# Patient Record
Sex: Female | Born: 1985 | Race: Black or African American | Hispanic: No | Marital: Single | Smoking: Never smoker
Health system: Southern US, Community
[De-identification: ages and names within clinical notes are randomized; demographics above are authoritative.]

## PROBLEM LIST (undated history)

## (undated) DIAGNOSIS — N920 Excessive and frequent menstruation with regular cycle: Secondary | ICD-10-CM

## (undated) DIAGNOSIS — R569 Unspecified convulsions: Secondary | ICD-10-CM

## (undated) HISTORY — PX: NO PAST SURGERIES: SHX2092

---
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
## 2008-03-13 ENCOUNTER — Emergency Department: Payer: Self-pay | Admitting: Emergency Medicine

## 2013-09-08 DIAGNOSIS — N871 Moderate cervical dysplasia: Secondary | ICD-10-CM | POA: Insufficient documentation

## 2015-05-31 DIAGNOSIS — N879 Dysplasia of cervix uteri, unspecified: Secondary | ICD-10-CM | POA: Insufficient documentation

## 2015-11-13 ENCOUNTER — Emergency Department: Payer: PRIVATE HEALTH INSURANCE

## 2015-11-13 ENCOUNTER — Emergency Department
Admission: EM | Admit: 2015-11-13 | Discharge: 2015-11-13 | Disposition: A | Discharge status: Home / Self Care | Payer: PRIVATE HEALTH INSURANCE | Attending: Emergency Medicine | Admitting: Emergency Medicine

## 2015-11-13 ENCOUNTER — Encounter: Payer: Self-pay | Admitting: Emergency Medicine

## 2015-11-13 DIAGNOSIS — R55 Syncope and collapse: Secondary | ICD-10-CM | POA: Diagnosis not present

## 2015-11-13 DIAGNOSIS — J101 Influenza due to other identified influenza virus with other respiratory manifestations: Secondary | ICD-10-CM

## 2015-11-13 DIAGNOSIS — R000 Tachycardia, unspecified: Secondary | ICD-10-CM | POA: Diagnosis not present

## 2015-11-13 DIAGNOSIS — R509 Fever, unspecified: Secondary | ICD-10-CM | POA: Diagnosis present

## 2015-11-13 DIAGNOSIS — Z3202 Encounter for pregnancy test, result negative: Secondary | ICD-10-CM | POA: Diagnosis not present

## 2015-11-13 LAB — CBC WITH DIFFERENTIAL/PLATELET
BASOS PCT: 0 %
Basophils Absolute: 0 10*3/uL (ref 0–0.1)
EOS PCT: 0 %
Eosinophils Absolute: 0 10*3/uL (ref 0–0.7)
HEMATOCRIT: 38.4 % (ref 35.0–47.0)
Hemoglobin: 13.4 g/dL (ref 12.0–16.0)
Lymphocytes Relative: 12 %
Lymphs Abs: 1.1 10*3/uL (ref 1.0–3.6)
MCH: 32.8 pg (ref 26.0–34.0)
MCHC: 34.9 g/dL (ref 32.0–36.0)
MCV: 93.9 fL (ref 80.0–100.0)
MONO ABS: 0.6 10*3/uL (ref 0.2–0.9)
MONOS PCT: 7 %
NEUTROS ABS: 7.4 10*3/uL — AB (ref 1.4–6.5)
Neutrophils Relative %: 81 %
PLATELETS: 248 10*3/uL (ref 150–440)
RBC: 4.09 MIL/uL (ref 3.80–5.20)
RDW: 13 % (ref 11.5–14.5)
WBC: 9.2 10*3/uL (ref 3.6–11.0)

## 2015-11-13 LAB — COMPREHENSIVE METABOLIC PANEL
ALBUMIN: 4.5 g/dL (ref 3.5–5.0)
ALT: 20 U/L (ref 14–54)
ANION GAP: 7 (ref 5–15)
AST: 22 U/L (ref 15–41)
Alkaline Phosphatase: 64 U/L (ref 38–126)
BILIRUBIN TOTAL: 1.3 mg/dL — AB (ref 0.3–1.2)
BUN: 9 mg/dL (ref 6–20)
CHLORIDE: 107 mmol/L (ref 101–111)
CO2: 21 mmol/L — ABNORMAL LOW (ref 22–32)
Calcium: 9.4 mg/dL (ref 8.9–10.3)
Creatinine, Ser: 0.91 mg/dL (ref 0.44–1.00)
GFR calc Af Amer: >60 mL/min (ref >60)
GLUCOSE: 101 mg/dL — AB (ref 65–99)
POTASSIUM: 3.9 mmol/L (ref 3.5–5.1)
Sodium: 135 mmol/L (ref 135–145)
TOTAL PROTEIN: 8.1 g/dL (ref 6.5–8.1)

## 2015-11-13 LAB — GLUCOSE, CAPILLARY: GLUCOSE-CAPILLARY: 93 mg/dL (ref 65–99)

## 2015-11-13 LAB — LIPASE, BLOOD: LIPASE: 16 U/L (ref 11–51)

## 2015-11-13 LAB — HCG, QUANTITATIVE, PREGNANCY: hCG, Beta Chain, Quant, S: <1 m[IU]/mL (ref <5)

## 2015-11-13 LAB — RAPID INFLUENZA A&B ANTIGENS (ARMC ONLY)
INFLUENZA A (ARMC): POSITIVE
INFLUENZA B (ARMC): NEGATIVE

## 2015-11-13 MED ORDER — ONDANSETRON 4 MG PO TBDP: 4.0000 mg | ORAL_TABLET | Freq: Four times a day (QID) | ORAL | Status: DC | PRN

## 2015-11-13 MED ORDER — OSELTAMIVIR PHOSPHATE 75 MG PO CAPS
75.0000 mg | ORAL_CAPSULE | ORAL | Status: AC
  Administered 2015-11-13: 75 mg via ORAL
  Filled 2015-11-13 (×2): qty 1

## 2015-11-13 MED ORDER — OSELTAMIVIR PHOSPHATE 75 MG PO CAPS: 75.0000 mg | ORAL_CAPSULE | Freq: Two times a day (BID) | ORAL | Status: DC

## 2015-11-13 MED ORDER — SODIUM CHLORIDE 0.9 % IV BOLUS (SEPSIS)
2000.0000 mL | Freq: Once | INTRAVENOUS | Status: AC
  Administered 2015-11-13: 2000 mL via INTRAVENOUS

## 2015-11-13 MED ORDER — ACETAMINOPHEN 500 MG PO TABS
1000.0000 mg | ORAL_TABLET | ORAL | Status: AC
  Administered 2015-11-13: 1000 mg via ORAL
  Filled 2015-11-13: qty 2

## 2015-11-13 MED ORDER — ONDANSETRON HCL 4 MG/2ML IJ SOLN
4.0000 mg | Freq: Once | INTRAMUSCULAR | Status: AC
  Administered 2015-11-13: 4 mg via INTRAVENOUS
  Filled 2015-11-13: qty 2

## 2015-11-13 NOTE — Discharge Instructions (Signed)
You were diagnosed with the flu (influenza).  You will feel ill for as much as a few weeks.  Please take any prescribed medications as instructed, and you may use over-the-counter Tylenol and/or ibuprofen as needed according to label instructions (unless you have an allergy to either or have been told by your doctor not to take them).  Follow up with your physician as instructed above, and return to the Emergency Department (ED) if you are unable to tolerate fluids due to vomiting, have worsening trouble breathing, become extremely tired or difficult to awaken, or if you develop any other symptoms that concern you.   Influenza, Adult Influenza ("the flu") is a viral infection of the respiratory tract. It occurs more often in winter months because people spend more time in close contact with one another. Influenza can make you feel very sick. Influenza easily spreads from person to person (contagious). CAUSES  Influenza is caused by a virus that infects the respiratory tract. You can catch the virus by breathing in droplets from an infected person's cough or sneeze. You can also catch the virus by touching something that was recently contaminated with the virus and then touching your mouth, nose, or eyes. RISKS AND COMPLICATIONS You may be at risk for a more severe case of influenza if you smoke cigarettes, have diabetes, have chronic heart disease (such as heart failure) or lung disease (such as asthma), or if you have a weakened immune system. Elderly people and pregnant women are also at risk for more serious infections. The most common problem of influenza is a lung infection (pneumonia). Sometimes, this problem can require emergency medical care and may be life threatening. SIGNS AND SYMPTOMS  Symptoms typically last 4 to 10 days and may include:  Fever.  Chills.  Headache, body aches, and muscle aches.  Sore throat.  Chest discomfort and cough.  Poor appetite.  Weakness or feeling  tired.  Dizziness.  Nausea or vomiting. DIAGNOSIS  Diagnosis of influenza is often made based on your history and a physical exam. A nose or throat swab test can be done to confirm the diagnosis. TREATMENT  In mild cases, influenza goes away on its own. Treatment is directed at relieving symptoms. For more severe cases, your health care provider may prescribe antiviral medicines to shorten the sickness. Antibiotic medicines are not effective because the infection is caused by a virus, not by bacteria. HOME CARE INSTRUCTIONS  Take medicines only as directed by your health care provider.  Use a cool mist humidifier to make breathing easier.  Get plenty of rest until your temperature returns to normal. This usually takes 3 to 4 days.  Drink enough fluid to keep your urine clear or pale yellow.  Cover yourmouth and nosewhen coughing or sneezing,and wash your handswellto prevent thevirusfrom spreading.  Stay homefromwork orschool untilthe fever is gonefor at least 90full day. PREVENTION  An annual influenza vaccination (flu shot) is the best way to avoid getting influenza. An annual flu shot is now routinely recommended for all adults in the [place] SEEK MEDICAL CARE IF:  You experiencechest pain, yourcough worsens,or you producemore mucus.  Youhave nausea,vomiting, ordiarrhea.  Your fever returns or gets worse. SEEK IMMEDIATE MEDICAL CARE IF:  You havetrouble breathing, you become short of breath,or your skin ornails becomebluish.  You have severe painor stiffnessin the neck.  You develop a sudden headache, or pain in the face or ear.  You have nausea or vomiting that you cannot control. MAKE SURE YOU:  Understand these instructions.  Will watch your condition.  Will get help right away if you are not doing well or get worse.   This information is not intended to replace advice given to you by your health care provider. Make sure you discuss any  questions you have with your health care provider.   Document Released: 09/06/2000 Document Revised: 09/30/2014 Document Reviewed: 12/09/2011 Elsevier Interactive Patient Education [employer].

## 2015-11-13 NOTE — ED Notes (Signed)
Pt was walking with a RN to a room and had a loc . Pt was placed on a strecher and moved to room 8. MD in room . Pt vss. Pt is lethargic but will open eyes to voice.

## 2015-11-13 NOTE — ED Provider Notes (Signed)
[facility] Emergency Department Provider Note  ____________________________________________  Time seen: Approximately 5:27 PM  I have reviewed the triage vital signs and the nursing notes.   HISTORY  Chief Complaint Generalized Body Aches and Fever  and "unconscious"  The patient is nonverbal, unresponsive on my initial evaluation. EM caveat: Patient unable to provide history.  EM caveat: Patient unresponsive/unconscious  HPI Courtney [name] is a 30 y.o. female no previous medical history per mother.  Mother reports the patient has had increasing cough, runny nose and low-grade fevers which she thought might be "the flu". She's also had body aches. Came to the ER for evaluation, and while walking to x-ray the patient became lightheaded and said she felt weak and then "passed out". Mother reports she laid to her side and did not strike her head hard but did hit the floor.  A "CODE BLUE" was called initially to the hallway.   History reviewed. No pertinent past medical history.  There are no active problems to display for this patient.   History reviewed. No pertinent past surgical history.  Current Outpatient Rx  Name  Route  Sig  Dispense  Refill  . ondansetron (ZOFRAN ODT) 4 MG disintegrating tablet   Oral   Take 1 tablet (4 mg total) by mouth every 6 (six) hours as needed for nausea or vomiting.   20 tablet   0   . oseltamivir (TAMIFLU) 75 MG capsule   Oral   Take 1 capsule (75 mg total) by mouth 2 (two) times daily.   10 capsule   0     Allergies Review of patient's allergies indicates no known allergies.  No family history on file.  Social History Social History  Substance Use Topics  . Smoking status: Never Smoker   . Smokeless tobacco: None  . Alcohol Use: No    Review of Systems  EM caveat: The patient unable to provide, unconscious ____________________________________________   PHYSICAL EXAM:  VITAL SIGNS: ED  Triage Vitals  Enc Vitals Group     BP 11/13/15 1630 112/72 mmHg     Pulse Rate 11/13/15 1630 117     Resp 11/13/15 1630 20     Temp 11/13/15 1630 99.8 F (37.7 C)     Temp Source 11/13/15 1630 Oral     SpO2 11/13/15 1630 99 %     Weight 11/13/15 1630 141 lb (63.957 kg)     Height 11/13/15 1630  (1.676 m)     Head Cir --      Peak Flow --      Pain Score 11/13/15 1630 10     Pain Loc --      Pain Edu? --      Excl. in GC? --    On initial evaluation the patient was unresponsive. Respiratory adequately, normal oxygen saturation strong radial pulse. This lasted for only a couple of minutes and improves with supine positioning. Patient no loss of pulse, no arrhythmia. Normal sinus on telemetry throughout.  Full physical exam performed approximately 10-15 minutes after initial presentation at which time the patient's neurologic exam was much improved.  Constitutional: Alert and oriented. Well appearing and in no acute distress. Eyes: Conjunctivae are normal. PERRL. EOMI. Head: Atraumatic. Nose: Mild clear rhinorrhea. Mouth/Throat: Mucous membranes are moist.  Oropharynx non-erythematous. Neck: No stridor.  No cervical spine tenderness. No meningismus. Cardiovascular: Slightly tachycardic rate, regular rhythm. Grossly normal heart sounds.  Good peripheral circulation. Respiratory: Normal respiratory effort.  No retractions. Lungs CTAB. Gastrointestinal: Soft and nontender. No distention. No abdominal bruits. No CVA tenderness. Musculoskeletal: No lower extremity tenderness nor edema.  No joint effusions. Neurologic:  Normal speech and language. No gross focal neurologic deficits are appreciated. Skin:  Skin is warm, dry and intact. No rash noted. Psychiatric: Mood and affect are normal. Speech and behavior are normal.  ____________________________________________   LABS (all labs ordered are listed, but only abnormal results are displayed)  Labs Reviewed  CBC WITH  DIFFERENTIAL/PLATELET - Abnormal; Notable for the following:    Neutro Abs 7.4 (*)    All other components within normal limits  COMPREHENSIVE METABOLIC PANEL - Abnormal; Notable for the following:    CO2 21 (*)    Glucose, Bld 101 (*)    Total Bilirubin 1.3 (*)    All other components within normal limits  RAPID INFLUENZA A&B ANTIGENS ([facility] ONLY)  GLUCOSE, CAPILLARY  LIPASE, BLOOD  HCG, QUANTITATIVE, PREGNANCY   ____________________________________________  EKG  Reviewed and interpreted me at 1711 Heart rate 92 QRS 70 QTc 400 Normal sinus rhythm, no acute ischemic abnormalities. No prolonged QT, Brugada, or WPW noted.  ____________________________________________  RADIOLOGY  DG Chest Port 1 View (Final result) Result time: 11/13/15 17:35:29   Final result by Rad Results In Interface (11/13/15 17:35:29)   Narrative:   CLINICAL DATA: Flu-like symptoms today, fever, generalized body aches  EXAM: PORTABLE CHEST 1 VIEW  COMPARISON: 09/11/2005  FINDINGS: Cardiomediastinal silhouette is unremarkable. No acute infiltrate or pleural effusion. No pulmonary edema. Bilateral nipple metallic pins are noted.  IMPRESSION: No active disease.   Electronically Signed By: Natasha Mead M.D. On: 11/13/2015 17:35    ____________________________________________   PROCEDURES  Procedure(s) performed: None  Critical Care performed: Yes, see critical care note(s)  CRITICAL CARE Performed by: Sharyn Creamer   Total critical care time: 35 minutes  Critical care time was exclusive of separately billable procedures and treating other patients.  Critical care was necessary to treat or prevent imminent or life-threatening deterioration.  Critical care was time spent personally by me on the following activities: development of treatment plan with patient and/or surrogate as well as nursing, discussions with consultants, evaluation of patient's response to treatment,  examination of patient, obtaining history from patient or surrogate, ordering and performing treatments and interventions, ordering and review of laboratory studies, ordering and review of radiographic studies, pulse oximetry and re-evaluation of patient's condition.  Patient presented with an episode of unresponsiveness. She did quickly improve, and regained neurologic status to a normal exam. ____________________________________________   INITIAL IMPRESSION / ASSESSMENT AND PLAN / ED COURSE  Pertinent labs & imaging results that were available during my care of the patient were reviewed by me and considered in my medical decision making (see chart for details).  Patient presented for fever chills cough cold and myalgias. We'll walk in x-ray she had a syncopal episode. This was witnessed by ER staff and they report there was no seizure-like activity. Patient arrives the room unresponsive.  After approximately 3-5 minutes the patient slowly began mentating.   Differential diagnosis broad. Patient does however have viral/influenza-like symptoms. We'll obtain a chest x-ray, EKG, labs and evaluate closely.  ----------------------------------------- 6:42 PM on 11/13/2015 -----------------------------------------  Patient now fully awake and alert. She is in no distress. She reports feeling mild nausea. She feels much better. Patient now fully awake and alert. She reports symptoms of muscle aches, low-grade fever, runny nose and a dry cough for about the last 2 days.  She presently reports she does feel slightly "dehydrated". No chest pain. No trouble breathing.  The patient has no pronator drift. The patient has normal cranial nerve exam. Extraocular movements are normal. Visual fields are normal. Patient has 5 out of 5 strength in all extremities. There is no numbness or gross, acute sensory abnormality in the extremities bilaterally. No speech disturbance. No dysarthria. No aphasia. No  ataxia. Patient speaking in full and clear sentences.  ----------------------------------------- 7:12 PM on 11/13/2015 -----------------------------------------  Patient reports improvement. Discussed diagnosis, treatment recommendations. She is not orthostatic. Patient heart rate currently 90, improved after fluids. Patient did have slight tachycardia with orthostatics, no hypotension. She is taking by mouth well, in no distress at this time. Careful return precautions and follow-up recommendations advised. No hypoxia. Family taking her home.    ____________________________________________   FINAL CLINICAL IMPRESSION(S) / ED DIAGNOSES  Final diagnoses:  Influenza A  Vasovagal syncope      Sharyn Creamer, MD 11/14/15 [phone]

## 2015-11-13 NOTE — ED Notes (Signed)
Pt presents with flu like sx for two days.

## 2016-10-17 ENCOUNTER — Encounter: Payer: Self-pay | Admitting: Emergency Medicine

## 2016-10-17 ENCOUNTER — Emergency Department
Admission: EM | Admit: 2016-10-17 | Discharge: 2016-10-17 | Disposition: A | Discharge status: Home / Self Care | Payer: PRIVATE HEALTH INSURANCE | Attending: Emergency Medicine | Admitting: Emergency Medicine

## 2016-10-17 DIAGNOSIS — H00024 Hordeolum internum left upper eyelid: Secondary | ICD-10-CM | POA: Insufficient documentation

## 2016-10-17 MED ORDER — TOBRAMYCIN 0.3 % OP SOLN: 2.0000 [drp] | OPHTHALMIC | 0 refills | Status: DC

## 2016-10-17 NOTE — ED Provider Notes (Signed)
[facility] Emergency Department Provider Note  ____________________________________________   First MD Initiated Contact with Patient 10/17/16 [phone]     (approximate)  I have reviewed the triage vital signs and the nursing notes.   HISTORY  Chief Complaint Eye Pain    HPI Courtney [name] is a 31 y.o. female is here with complaint of left eye irritation. Patient states she works morning with a raised area noted on her left upper eyelid. She denies any injury. She denies any change in vision. There's been no drainage from the eye and she denies her eyelashes being matted shut. She is unaware of any previous problems such as this. She rates her pain as a 3/10.   History reviewed. No pertinent past medical history.  There are no active problems to display for this patient.   History reviewed. No pertinent surgical history.  Prior to Admission medications   Medication Sig Start Date End Date Taking? Authorizing Provider  tobramycin (TOBREX) 0.3 % ophthalmic solution Place 2 drops into the left eye every 4 (four) hours. 10/17/16   Tommi Rumpshonda L Juhi Lagrange, PA-C    Allergies Patient has no known allergies.  No family history on file.  Social History Social History  Substance Use Topics  . Smoking status: Never Smoker  . Smokeless tobacco: Never Used  . Alcohol use No    Review of Systems Constitutional: No fever/chills Eyes: No visual changes.Positive Discomfort left upper eyelid. ENT: No sore throat. Cardiovascular: Denies chest pain. Respiratory: Denies shortness of breath. Gastrointestinal:  No nausea, no vomiting.   Skin: Negative for rash. Neurological: Negative for headaches, focal weakness or numbness.  10-point ROS otherwise negative.  ____________________________________________   PHYSICAL EXAM:  VITAL SIGNS: ED Triage Vitals  Enc Vitals Group     BP 10/17/16 0728 116/79     Pulse Rate 10/17/16 0728 77     Resp 10/17/16 0728 20     Temp 10/17/16 0728 98.4 F (36.9 C)     Temp Source 10/17/16 0728 Oral     SpO2 10/17/16 0728 100 %     Weight 10/17/16 0728 144 lb (65.3 kg)     Height 10/17/16 0728 5\' 6"  (1.676 m)     Head Circumference --      Peak Flow --      Pain Score 10/17/16 0729 3     Pain Loc --      Pain Edu? --      Excl. in GC? --     Constitutional: Alert and oriented. Well appearing and in no acute distress. Eyes: Conjunctivae are normal. PERRL. EOMI. Left upper eyelid there is a fleshy nodule noted that is nontender to palpation. There is no erythema or drainage noted. Sclerae clear. No foreign body noted. Head: Atraumatic. Nose: No congestion/rhinnorhea. Neck: No stridor.   Cardiovascular: Normal rate, regular rhythm. Grossly normal heart sounds.  Good peripheral circulation. Respiratory: Normal respiratory effort.  No retractions. Lungs CTAB. Musculoskeletal: No lower extremity tenderness nor edema.  No joint effusions. Neurologic:  Normal speech and language. No gross focal neurologic deficits are appreciated.  Skin:  Skin is warm, dry and intact. As noted above on eyes. Psychiatric: Mood and affect are normal. Speech and behavior are normal.  ____________________________________________   LABS (all labs ordered are listed, but only abnormal results are displayed)  Labs Reviewed - No data to display   PROCEDURES  Procedure(s) performed: None  Procedures  Critical Care performed: No  ____________________________________________  INITIAL IMPRESSION / ASSESSMENT AND PLAN / ED COURSE  Pertinent labs & imaging results that were available during my care of the patient were reviewed by me and considered in my medical decision making (see chart for details).   Patient is given instructions on corneal limbs. She is to use warm compresses and also she is given a prescription for Tobrex ophthalmic solution to use to her left eye every 4 hours while awake. Patient is also to follow-up  with [facility] if any continued problems with her left eye. Vision today in the emergency room was reviewed.   ____________________________________________   FINAL CLINICAL IMPRESSION(S) / ED DIAGNOSES  Final diagnoses:  Hordeolum internum left upper eyelid      NEW MEDICATIONS STARTED DURING THIS VISIT:  Discharge Medication List as of 10/17/2016  8:07 AM    START taking these medications   Details  tobramycin (TOBREX) 0.3 % ophthalmic solution Place 2 drops into the left eye every 4 (four) hours., Starting Thu 10/17/2016, Print         Note:  This document was prepared using Dragon voice recognition software and may include unintentional dictation errors.    Tommi Rumps, PA-C 10/17/16 1520    Emily Filbert, MD 10/18/16 1455

## 2016-10-17 NOTE — ED Triage Notes (Signed)
States she woke up with left eye irritation this   A raised area note left eye lid

## 2016-10-17 NOTE — Discharge Instructions (Signed)
Follow-up with [facility] if any continued problems. Begin using warm compresses to your eyelid frequently. Use Tobrex ophthalmic solution 2 drops every 4 hours to the left eye while awake.

## 2017-02-07 ENCOUNTER — Encounter: Payer: Self-pay | Admitting: Certified Nurse Midwife

## 2017-03-04 ENCOUNTER — Emergency Department
Admission: EM | Admit: 2017-03-04 | Discharge: 2017-03-04 | Disposition: A | Discharge status: Home / Self Care | Payer: Self-pay | Attending: Emergency Medicine | Admitting: Emergency Medicine

## 2017-03-04 DIAGNOSIS — N3000 Acute cystitis without hematuria: Secondary | ICD-10-CM | POA: Insufficient documentation

## 2017-03-04 DIAGNOSIS — Z79899 Other long term (current) drug therapy: Secondary | ICD-10-CM | POA: Insufficient documentation

## 2017-03-04 LAB — URINALYSIS, COMPLETE (UACMP) WITH MICROSCOPIC
Bilirubin Urine: NEGATIVE
Bilirubin Urine: NEGATIVE
GLUCOSE, UA: NEGATIVE mg/dL
GLUCOSE, UA: NEGATIVE mg/dL
Hgb urine dipstick: NEGATIVE
Hgb urine dipstick: NEGATIVE
Ketones, ur: NEGATIVE mg/dL
Ketones, ur: NEGATIVE mg/dL
Leukocytes, UA: NEGATIVE
Leukocytes, UA: NEGATIVE
Nitrite: NEGATIVE
Nitrite: NEGATIVE
PH: 6 (ref 5.0–8.0)
Protein, ur: NEGATIVE mg/dL
Protein, ur: NEGATIVE mg/dL
SPECIFIC GRAVITY, URINE: 1.015 (ref 1.005–1.030)
SPECIFIC GRAVITY, URINE: 1.021 (ref 1.005–1.030)
pH: 7 (ref 5.0–8.0)

## 2017-03-04 LAB — WET PREP, GENITAL
CLUE CELLS WET PREP: NONE SEEN
SPERM: NONE SEEN
TRICH WET PREP: NONE SEEN
YEAST WET PREP: NONE SEEN

## 2017-03-04 LAB — POCT PREGNANCY, URINE: Preg Test, Ur: NEGATIVE

## 2017-03-04 MED ORDER — SULFAMETHOXAZOLE-TRIMETHOPRIM 800-160 MG PO TABS
1.0000 | ORAL_TABLET | Freq: Two times a day (BID) | ORAL | 0 refills | Status: DC
Start: 2017-03-04 — End: 2017-07-24

## 2017-03-04 MED ORDER — SULFAMETHOXAZOLE-TRIMETHOPRIM 800-160 MG PO TABS
1.0000 | ORAL_TABLET | Freq: Once | ORAL | Status: AC
  Administered 2017-03-04: 1 via ORAL
  Filled 2017-03-04: qty 1

## 2017-03-04 NOTE — ED Provider Notes (Signed)
[facility]-EMERGENCY DEPARTMENT Provider Note   CSN: [id number] Arrival date & time: 03/04/17  1906     History   Chief Complaint Chief Complaint  Patient presents with  . Urinary Tract Infection    HPI VIRGINIA CURL is a 31 y.o. female presents to the emergency department for evaluation of increasing urinary frequency 1 week. Patient states she's had burning and urinary frequency 1 week. No fevers, back pain, nausea or vomiting. She has had some vaginal discharge. No new sexual partners. No painful intercourse. She has noticed a odor to her urine.   HPI  History reviewed. No pertinent past medical history.  There are no active problems to display for this patient.   History reviewed. No pertinent surgical history.  OB History    No data available       Home Medications    Prior to Admission medications   Medication Sig Start Date End Date Taking? Authorizing Provider  sulfamethoxazole-trimethoprim (BACTRIM DS,SEPTRA DS) 800-160 MG tablet Take 1 tablet by mouth 2 (two) times daily. 03/04/17   Evon Slack, PA-C  tobramycin (TOBREX) 0.3 % ophthalmic solution Place 2 drops into the left eye every 4 (four) hours. 10/17/16   Tommi Rumps, PA-C    Family History No family history on file.  Social History Social History  Substance Use Topics  . Smoking status: Never Smoker  . Smokeless tobacco: Never Used  . Alcohol use No     Comment: occ     Allergies   Patient has no known allergies.   Review of Systems Review of Systems  Constitutional: Negative for activity change, chills, fatigue and fever.  HENT: Negative for congestion, sinus pressure and sore throat.   Eyes: Negative for visual disturbance.  Respiratory: Negative for cough, chest tightness and shortness of breath.   Cardiovascular: Negative for chest pain and leg swelling.  Gastrointestinal: Negative for abdominal pain, diarrhea, nausea and vomiting.  Genitourinary: Positive for dysuria.  Negative for dyspareunia and flank pain.  Musculoskeletal: Negative for arthralgias and gait problem.  Skin: Negative for rash.  Neurological: Negative for weakness, numbness and headaches.  Hematological: Negative for adenopathy.  Psychiatric/Behavioral: Negative for agitation, behavioral problems and confusion.     Physical Exam Updated Vital Signs BP 115/68 (BP Location: Left Arm)   Temp 98.5 F (36.9 C) (Oral)   Resp 18   Ht 5\' 6"  (1.676 m)   Wt 64 kg (141 lb)   LMP 02/13/2017   SpO2 100%   BMI 22.76 kg/m   Physical Exam  Constitutional: She is oriented to person, place, and time. She appears well-developed and well-nourished. No distress.  HENT:  Head: Normocephalic and atraumatic.  Mouth/Throat: Oropharynx is clear and moist.  Eyes: EOM are normal. Pupils are equal, round, and reactive to light. Right eye exhibits no discharge. Left eye exhibits no discharge.  Neck: Normal range of motion. Neck supple.  Cardiovascular: Normal rate, regular rhythm and intact distal pulses.   Pulmonary/Chest: Effort normal and breath sounds normal. No respiratory distress. She exhibits no tenderness.  Abdominal: Soft. She exhibits no distension. There is no tenderness.  Musculoskeletal: Normal range of motion. She exhibits no edema.  Neurological: She is alert and oriented to person, place, and time. She has normal reflexes.  Skin: Skin is warm and dry.  Psychiatric: She has a normal mood and affect. Her behavior is normal. Thought content normal.     ED Treatments / Results  Labs (all labs ordered are listed,  but only abnormal results are displayed) Labs Reviewed  WET PREP, GENITAL - Abnormal; Notable for the following:       Result Value   WBC, Wet Prep HPF POC MANY (*)    All other components within normal limits  URINALYSIS, COMPLETE (UACMP) WITH MICROSCOPIC - Abnormal; Notable for the following:    Color, Urine YELLOW (*)    APPearance HAZY (*)    Bacteria, UA RARE (*)     Squamous Epithelial / LPF 6-30 (*)    All other components within normal limits  URINALYSIS, COMPLETE (UACMP) WITH MICROSCOPIC - Abnormal; Notable for the following:    Color, Urine YELLOW (*)    APPearance CLEAR (*)    Bacteria, UA RARE (*)    Squamous Epithelial / LPF 0-5 (*)    All other components within normal limits  CHLAMYDIA/NGC RT PCR ([facility] ONLY)  POCT PREGNANCY, URINE  POC URINE PREG, ED    EKG  EKG Interpretation None       Radiology No results found.  Procedures Procedures (including critical care time)  Medications Ordered in ED Medications  sulfamethoxazole-trimethoprim (BACTRIM DS,SEPTRA DS) 800-160 MG per tablet 1 tablet (not administered)     Initial Impression / Assessment and Plan / ED Course  I have reviewed the triage vital signs and the nursing notes.  Pertinent labs & imaging results that were available during my care of the patient were reviewed by me and considered in my medical decision making (see chart for details).     31 year old female with dysuria. Urinalysis consistent with urinary tract infection. She is placed on oral antibiotic. She will increase fluids. She is educated on signs and symptoms returned 84.   Final Clinical Impressions(s) / ED Diagnoses   Final diagnoses:  Acute cystitis without hematuria    New Prescriptions New Prescriptions   SULFAMETHOXAZOLE-TRIMETHOPRIM (BACTRIM DS,SEPTRA DS) 800-160 MG TABLET    Take 1 tablet by mouth 2 (two) times daily.     Evon SlackGaines, Shanikka Wonders C, PA-C 03/04/17 2210    Merrily Brittleifenbark, Neil, MD 03/04/17 2234

## 2017-03-04 NOTE — ED Notes (Signed)
Pt states urinary frequency x 1 week. States right before she goes she feels urgency and then lower abd cramping. Denies fever. Alert, oriented, ambulatory.

## 2017-03-04 NOTE — Discharge Instructions (Signed)
Please take medications as prescribed, drink lots of fluids. Return to the ER for any worsening symptoms urgent changes in her health.

## 2017-03-04 NOTE — ED Triage Notes (Signed)
Pt reports that she has a UTI - she report ammonia smell to urine with a urge to urinate and feels discomfort with voiding

## 2017-03-05 LAB — CHLAMYDIA/NGC RT PCR (ARMC ONLY)
Chlamydia Tr: NOT DETECTED
N GONORRHOEAE: NOT DETECTED

## 2017-07-19 ENCOUNTER — Emergency Department: Payer: Self-pay

## 2017-07-19 ENCOUNTER — Emergency Department
Admission: EM | Admit: 2017-07-19 | Discharge: 2017-07-20 | Disposition: A | Discharge status: Home / Self Care | Payer: Self-pay | Attending: Emergency Medicine | Admitting: Emergency Medicine

## 2017-07-19 DIAGNOSIS — Z79899 Other long term (current) drug therapy: Secondary | ICD-10-CM | POA: Insufficient documentation

## 2017-07-19 DIAGNOSIS — R51 Headache: Secondary | ICD-10-CM | POA: Insufficient documentation

## 2017-07-19 DIAGNOSIS — R569 Unspecified convulsions: Secondary | ICD-10-CM | POA: Insufficient documentation

## 2017-07-19 LAB — BASIC METABOLIC PANEL
ANION GAP: 8 (ref 5–15)
BUN: 10 mg/dL (ref 6–20)
CO2: 23 mmol/L (ref 22–32)
Calcium: 9.2 mg/dL (ref 8.9–10.3)
Chloride: 108 mmol/L (ref 101–111)
Creatinine, Ser: 0.78 mg/dL (ref 0.44–1.00)
GFR calc Af Amer: >60 mL/min (ref >60)
Glucose, Bld: 88 mg/dL (ref 65–99)
POTASSIUM: 3.9 mmol/L (ref 3.5–5.1)
SODIUM: 139 mmol/L (ref 135–145)

## 2017-07-19 LAB — CBC WITH DIFFERENTIAL/PLATELET
BASOS ABS: 0 10*3/uL (ref 0–0.1)
Basophils Relative: 1 %
Eosinophils Absolute: 0 10*3/uL (ref 0–0.7)
Eosinophils Relative: 1 %
HEMATOCRIT: 35.9 % (ref 35.0–47.0)
Hemoglobin: 12.5 g/dL (ref 12.0–16.0)
LYMPHS PCT: 27 %
Lymphs Abs: 1.7 10*3/uL (ref 1.0–3.6)
MCH: 32.9 pg (ref 26.0–34.0)
MCHC: 34.9 g/dL (ref 32.0–36.0)
MCV: 94.4 fL (ref 80.0–100.0)
MONO ABS: 0.4 10*3/uL (ref 0.2–0.9)
MONOS PCT: 7 %
NEUTROS ABS: 4 10*3/uL (ref 1.4–6.5)
Neutrophils Relative %: 64 %
PLATELETS: 213 10*3/uL (ref 150–440)
RBC: 3.81 MIL/uL (ref 3.80–5.20)
RDW: 12.8 % (ref 11.5–14.5)
WBC: 6.2 10*3/uL (ref 3.6–11.0)

## 2017-07-19 LAB — HEPATIC FUNCTION PANEL
ALT: 15 U/L (ref 14–54)
AST: 21 U/L (ref 15–41)
Albumin: 4 g/dL (ref 3.5–5.0)
Alkaline Phosphatase: 46 U/L (ref 38–126)
Bilirubin, Direct: 0.1 mg/dL (ref 0.1–0.5)
Indirect Bilirubin: 1 mg/dL — ABNORMAL HIGH (ref 0.3–0.9)
TOTAL PROTEIN: 7.5 g/dL (ref 6.5–8.1)
Total Bilirubin: 1.1 mg/dL (ref 0.3–1.2)

## 2017-07-19 LAB — HCG, QUANTITATIVE, PREGNANCY: hCG, Beta Chain, Quant, S: <1 m[IU]/mL (ref <5)

## 2017-07-19 MED ORDER — SODIUM CHLORIDE 0.9 % IV BOLUS (SEPSIS)
1000.0000 mL | Freq: Once | INTRAVENOUS | Status: AC
  Administered 2017-07-19: 1000 mL via INTRAVENOUS

## 2017-07-19 NOTE — ED Triage Notes (Signed)
Pt presents via EMS c/o right sided chest pain. Per EMS pt was taking shower and presented to family following "acting weird". Possible seizure per EMS report. Pt reports headache and right sided chest pain. Denies remembering episode after getting out of shower. Pt A&O x4 at this time.

## 2017-07-19 NOTE — ED Provider Notes (Signed)
[facility] Emergency Department Provider Note  ____________________________________________   First MD Initiated Contact with Patient 07/19/17 1249     (approximate)  I have reviewed the triage vital signs and the nursing notes.   HISTORY  Chief Complaint Chest Pain and Seizures  Level 5 exemption history limited by the patient's clinical condition  HPI Courtney [name] is a 31 y.o. female who was brought to the emergency department by EMS after possible syncope versus seizure.  The patient is amnestic to events.  According to EMS family reported the patient was in the shower today when she got out she said she felt lightheaded and sat herself down onto the ground.  She then had what may have been generalized tonic-clonic motions.  She had normal vitals en route along with a normal blood sugar.  As the patient had one previous lifetime seizure at age 31 and never since.  She takes no medications.  No past medical history on file.  There are no active problems to display for this patient.   No past surgical history on file.  Prior to Admission medications   Medication Sig Start Date End Date Taking? Authorizing Provider  Boric Acid POWD Place 1 suppository vaginally.   Yes [provider]  Probiotic Product (PROBIOTIC DAILY) CAPS Take 1 capsule by mouth.   Yes [provider]  sulfamethoxazole-trimethoprim (BACTRIM DS,SEPTRA DS) 800-160 MG tablet Take 1 tablet by mouth 2 (two) times daily. Patient not taking: Reported on 07/19/2017 03/04/17   Evon SlackGaines, Thomas C, PA-C  tobramycin (TOBREX) 0.3 % ophthalmic solution Place 2 drops into the left eye every 4 (four) hours. Patient not taking: Reported on 07/19/2017 10/17/16   Tommi RumpsSummers, Rhonda L, PA-C    Allergies Patient has no known allergies.  No family history on file.  Social History Social History  Substance Use Topics  . Smoking status: Never Smoker  . Smokeless tobacco: Never Used  .  Alcohol use No     Comment: occ    Review of Systems Level 5 exemption history limited by the patient's clinical condition ____________________________________________   PHYSICAL EXAM:  VITAL SIGNS: ED Triage Vitals  Enc Vitals Group     BP      Pulse      Resp      Temp      Temp src      SpO2      Weight      Height      Head Circumference      Peak Flow      Pain Score      Pain Loc      Pain Edu?      Excl. in GC?     Constitutional: Alert and oriented x4 pleasant cooperative speaks full sentences no diaphoresis Eyes: PERRL EOMI. Head: Atraumatic. Nose: No congestion/rhinnorhea. Mouth/Throat: No trismus no bites to her tongue or cheek Neck: No stridor.   Cardiovascular: Normal rate, regular rhythm. Grossly normal heart sounds.  Good peripheral circulation. Respiratory: Normal respiratory effort.  No retractions. Lungs CTAB and moving good air Gastrointestinal: Soft nontender Musculoskeletal: No lower extremity edema   Neurologic:  Normal speech and language.  No pronator drift 5 out of 5 grips biceps triceps hip flexion hip extension sensation intact light touch throughout Skin:  Skin is warm, dry and intact. No rash noted. Psychiatric: Mood and affect are normal. Speech and behavior are normal.    ____________________________________________   DIFFERENTIAL includes but not  limited to  Seizure, syncope, intracerebral hemorrhage, brain tumor, sepsis ____________________________________________   LABS (all labs ordered are listed, but only abnormal results are displayed)  Labs Reviewed  HEPATIC FUNCTION PANEL - Abnormal; Notable for the following:       Result Value   Indirect Bilirubin 1.0 (*)    All other components within normal limits  BASIC METABOLIC PANEL  CBC WITH DIFFERENTIAL/PLATELET  HCG, QUANTITATIVE, PREGNANCY  URINALYSIS, COMPLETE (UACMP) WITH MICROSCOPIC    Blood work reviewed and interpreted by me with no acute disease in the  patient is not. __________________________________________  EKG  ED ECG REPORT I, Merrily Brittle, the attending physician, personally viewed and interpreted this ECG.  Date: 07/19/2017 EKG Time:  Rate: 82 Rhythm: normal sinus rhythm QRS Axis: normal Intervals: normal ST/T Wave abnormalities: normal Narrative Interpretation: no evidence of acute ischemia specifically no blocks, normal intervals, no arrhythmia, no Brugada  ____________________________________________  RADIOLOGY  Head CT reviewed by me with no acute disease ____________________________________________   PROCEDURES  Procedure(s) performed: no  Procedures  Critical Care performed: no  Observation: no ____________________________________________   INITIAL IMPRESSION / ASSESSMENT AND PLAN / ED COURSE  Pertinent labs & imaging results that were available during my care of the patient were reviewed by me and considered in my medical decision making (see chart for details).      Family at bedside able to provide collateral history.  The patient's previous seizure when she was younger was not actually a seizure she had a clear syncopal episode secondary to anemia.  She has never actually had a seizure.  According to her boyfriend on the phone who is present patient collapsed while in the shower and did have generalized tonic-clonic motions.  It took her 20-30 minutes to become normal again.  Had a lengthy discussion with the patient and her family at bedside that for her first lifetime seizure she does not require an MRI or an EEG and we would not start antiepileptic medications.  She understands she is not to drive for 6 months and that if she has a second seizure she must have an MRI, EEG, and to begin antiepileptic medications.  Strict return precautions have been given and the patient and family verbalized understanding and agreement with plan.  ____________________________________________   FINAL CLINICAL  IMPRESSION(S) / ED DIAGNOSES  Final diagnoses:  Seizure (HCC)      NEW MEDICATIONS STARTED DURING THIS VISIT:  New Prescriptions   No medications on file     Note:  This document was prepared using Dragon voice recognition software and may include unintentional dictation errors.     Merrily Brittle, MD 07/19/17 1444

## 2017-07-19 NOTE — Discharge Instructions (Signed)
Fortunately today your blood work and your CT scan were very reassuring.  Please do not drive for the next 6 months because you are at risk for having another seizure.  Follow-up with your primary care physician in a week for reevaluation and return to the emergency department for any concerns.  It was a pleasure to take care of you today, and thank you for coming to our emergency department.  If you have any questions or concerns before leaving please ask the nurse to grab me and I'm more than happy to go through your aftercare instructions again.  If you were prescribed any opioid pain medication today such as Norco, Vicodin, Percocet, morphine, hydrocodone, or oxycodone please make sure you do not drive when you are taking this medication as it can alter your ability to drive safely.  If you have any concerns once you are home that you are not improving or are in fact getting worse before you can make it to your follow-up appointment, please do not hesitate to call 911 and come back for further evaluation.  Merrily BrittleNeil Duward Allbritton, MD  Results for orders placed or performed during the hospital encounter of 07/19/17  Basic metabolic panel  Result Value Ref Range   Sodium 139 135 - 145 mmol/L   Potassium 3.9 3.5 - 5.1 mmol/L   Chloride 108 101 - 111 mmol/L   CO2 23 22 - 32 mmol/L   Glucose, Bld 88 65 - 99 mg/dL   BUN 10 6 - 20 mg/dL   Creatinine, Ser 1.610.78 0.44 - 1.00 mg/dL   Calcium 9.2 8.9 - 09.610.3 mg/dL   GFR calc non Af Amer >60 >60 mL/min   GFR calc Af Amer >60 >60 mL/min   Anion gap 8 5 - 15  Hepatic function panel  Result Value Ref Range   Total Protein 7.5 6.5 - 8.1 g/dL   Albumin 4.0 3.5 - 5.0 g/dL   AST 21 15 - 41 U/L   ALT 15 14 - 54 U/L   Alkaline Phosphatase 46 38 - 126 U/L   Total Bilirubin 1.1 0.3 - 1.2 mg/dL   Bilirubin, Direct 0.1 0.1 - 0.5 mg/dL   Indirect Bilirubin 1.0 (H) 0.3 - 0.9 mg/dL  CBC with Differential  Result Value Ref Range   WBC 6.2 3.6 - 11.0 K/uL   RBC 3.81  3.80 - 5.20 MIL/uL   Hemoglobin 12.5 12.0 - 16.0 g/dL   HCT 04.535.9 40.935.0 - 81.147.0 %   MCV 94.4 80.0 - 100.0 fL   MCH 32.9 26.0 - 34.0 pg   MCHC 34.9 32.0 - 36.0 g/dL   RDW 91.412.8 78.211.5 - 95.614.5 %   Platelets 213 150 - 440 K/uL   Neutrophils Relative % 64 %   Neutro Abs 4.0 1.4 - 6.5 K/uL   Lymphocytes Relative 27 %   Lymphs Abs 1.7 1.0 - 3.6 K/uL   Monocytes Relative 7 %   Monocytes Absolute 0.4 0.2 - 0.9 K/uL   Eosinophils Relative 1 %   Eosinophils Absolute 0.0 0 - 0.7 K/uL   Basophils Relative 1 %   Basophils Absolute 0.0 0 - 0.1 K/uL  hCG, quantitative, pregnancy  Result Value Ref Range   hCG, Beta Chain, Quant, S <1 <5 mIU/mL   Ct Head Wo Contrast  Result Date: 07/19/2017 CLINICAL DATA:  Right-sided chest pain.  Possible seizure. EXAM: CT HEAD WITHOUT CONTRAST TECHNIQUE: Contiguous axial images were obtained from the base of the skull through the vertex without  intravenous contrast. COMPARISON:  None. FINDINGS: Brain: No evidence of acute infarction, hemorrhage, hydrocephalus, extra-axial collection or mass lesion/mass effect. Vascular: No hyperdense vessel or unexpected calcification. Skull: Normal. Negative for fracture or focal lesion. Sinuses/Orbits: Negative. IMPRESSION: Negative head CT. Electronically Signed   By: Marnee Spring M.D.   On: 07/19/2017 14:08

## 2017-07-23 ENCOUNTER — Observation Stay
Admission: EM | Admit: 2017-07-23 | Discharge: 2017-07-24 | Disposition: A | Discharge status: Home / Self Care | Payer: Self-pay | Attending: Internal Medicine | Admitting: Internal Medicine

## 2017-07-23 ENCOUNTER — Emergency Department: Payer: Self-pay

## 2017-07-23 ENCOUNTER — Observation Stay (HOSPITAL_BASED_OUTPATIENT_CLINIC_OR_DEPARTMENT_OTHER)
Admit: 2017-07-23 | Discharge: 2017-07-23 | Disposition: A | Discharge status: Home / Self Care | Payer: Self-pay | Attending: Internal Medicine | Admitting: Internal Medicine

## 2017-07-23 ENCOUNTER — Encounter: Payer: Self-pay | Admitting: Emergency Medicine

## 2017-07-23 DIAGNOSIS — R55 Syncope and collapse: Principal | ICD-10-CM | POA: Diagnosis present

## 2017-07-23 DIAGNOSIS — N920 Excessive and frequent menstruation with regular cycle: Secondary | ICD-10-CM | POA: Insufficient documentation

## 2017-07-23 DIAGNOSIS — I340 Nonrheumatic mitral (valve) insufficiency: Secondary | ICD-10-CM

## 2017-07-23 HISTORY — DX: Excessive and frequent menstruation with regular cycle: N92.0

## 2017-07-23 LAB — CBC
HEMATOCRIT: 35.8 % (ref 35.0–47.0)
HEMOGLOBIN: 12.4 g/dL (ref 12.0–16.0)
MCH: 33 pg (ref 26.0–34.0)
MCHC: 34.7 g/dL (ref 32.0–36.0)
MCV: 95 fL (ref 80.0–100.0)
Platelets: 201 10*3/uL (ref 150–440)
RBC: 3.77 MIL/uL — ABNORMAL LOW (ref 3.80–5.20)
RDW: 12.8 % (ref 11.5–14.5)
WBC: 4.9 10*3/uL (ref 3.6–11.0)

## 2017-07-23 LAB — URINALYSIS, COMPLETE (UACMP) WITH MICROSCOPIC
BACTERIA UA: NONE SEEN
SPECIFIC GRAVITY, URINE: 1.032 — AB (ref 1.005–1.030)

## 2017-07-23 LAB — TROPONIN I: Troponin I: <0.03 ng/mL (ref <0.03)

## 2017-07-23 LAB — COMPREHENSIVE METABOLIC PANEL
ALT: 20 U/L (ref 14–54)
ANION GAP: 10 (ref 5–15)
AST: 24 U/L (ref 15–41)
Albumin: 4.1 g/dL (ref 3.5–5.0)
Alkaline Phosphatase: 53 U/L (ref 38–126)
BUN: 7 mg/dL (ref 6–20)
CHLORIDE: 106 mmol/L (ref 101–111)
CO2: 23 mmol/L (ref 22–32)
Calcium: 9 mg/dL (ref 8.9–10.3)
Creatinine, Ser: 0.73 mg/dL (ref 0.44–1.00)
GFR calc Af Amer: >60 mL/min (ref >60)
GFR calc non Af Amer: >60 mL/min (ref >60)
GLUCOSE: 84 mg/dL (ref 65–99)
POTASSIUM: 3.7 mmol/L (ref 3.5–5.1)
Sodium: 139 mmol/L (ref 135–145)
Total Bilirubin: 0.7 mg/dL (ref 0.3–1.2)
Total Protein: 7.9 g/dL (ref 6.5–8.1)

## 2017-07-23 LAB — URINE DRUG SCREEN, QUALITATIVE (ARMC ONLY)
Amphetamines, Ur Screen: NOT DETECTED
BARBITURATES, UR SCREEN: NOT DETECTED
Benzodiazepine, Ur Scrn: NOT DETECTED
COCAINE METABOLITE, UR ~~LOC~~: NOT DETECTED
Cannabinoid 50 Ng, Ur ~~LOC~~: NOT DETECTED
MDMA (Ecstasy)Ur Screen: NOT DETECTED
METHADONE SCREEN, URINE: NOT DETECTED
Opiate, Ur Screen: NOT DETECTED
Phencyclidine (PCP) Ur S: NOT DETECTED
TRICYCLIC, UR SCREEN: NOT DETECTED

## 2017-07-23 LAB — POCT PREGNANCY, URINE: PREG TEST UR: NEGATIVE

## 2017-07-23 LAB — TSH: TSH: 1.104 u[IU]/mL (ref 0.350–4.500)

## 2017-07-23 MED ORDER — ENOXAPARIN SODIUM 40 MG/0.4ML ~~LOC~~ SOLN
40.0000 mg | SUBCUTANEOUS | Status: DC
  Filled 2017-07-23 (×2): qty 0.4

## 2017-07-23 MED ORDER — ACETAMINOPHEN 325 MG PO TABS
650.0000 mg | ORAL_TABLET | Freq: Four times a day (QID) | ORAL | Status: DC | PRN
  Administered 2017-07-23 – 2017-07-24 (×2): 650 mg via ORAL
  Filled 2017-07-23 (×2): qty 2

## 2017-07-23 MED ORDER — SODIUM CHLORIDE 0.9% FLUSH
3.0000 mL | Freq: Two times a day (BID) | INTRAVENOUS | Status: DC
  Administered 2017-07-23 – 2017-07-24 (×2): 3 mL via INTRAVENOUS

## 2017-07-23 MED ORDER — ONDANSETRON HCL 4 MG PO TABS: 4.0000 mg | ORAL_TABLET | Freq: Four times a day (QID) | ORAL | Status: DC | PRN

## 2017-07-23 MED ORDER — ONDANSETRON HCL 4 MG/2ML IJ SOLN: 4.0000 mg | Freq: Four times a day (QID) | INTRAMUSCULAR | Status: DC | PRN

## 2017-07-23 MED ORDER — SODIUM CHLORIDE 0.9 % IV SOLN: 250.0000 mL | INTRAVENOUS | Status: DC | PRN

## 2017-07-23 MED ORDER — ACETAMINOPHEN 650 MG RE SUPP: 650.0000 mg | Freq: Four times a day (QID) | RECTAL | Status: DC | PRN

## 2017-07-23 MED ORDER — SODIUM CHLORIDE 0.9% FLUSH
3.0000 mL | INTRAVENOUS | Status: DC | PRN
  Administered 2017-07-24: 3 mL via INTRAVENOUS
  Filled 2017-07-23: qty 3

## 2017-07-23 MED ORDER — ALBUTEROL SULFATE (2.5 MG/3ML) 0.083% IN NEBU: 2.5000 mg | INHALATION_SOLUTION | RESPIRATORY_TRACT | Status: DC | PRN

## 2017-07-23 MED ORDER — OXYCODONE HCL 5 MG PO TABS: 5.0000 mg | ORAL_TABLET | ORAL | Status: DC | PRN

## 2017-07-23 NOTE — Plan of Care (Signed)
Problem: Safety: Goal: Ability to remain free from injury will improve Outcome: Progressing Fall precautions in place, non skid socks when oob  Problem: Pain Managment: Goal: General experience of comfort will improve Outcome: Progressing Prn medications   

## 2017-07-23 NOTE — ED Notes (Signed)
Dr. Garald BraverWilliams okayed pt to have crackers. Crackers and ginger ale given to pt.

## 2017-07-23 NOTE — H&P (Addendum)
[employer] [facility] - [place] [facility]   PATIENT NAME: Courtney [name]    MR#:  [id number]  DATE OF BIRTH:  Aug 16, 1986  DATE OF ADMISSION:  07/23/2017  PRIMARY CARE PHYSICIAN: [facility]   REQUESTING/REFERRING PHYSICIAN: Emily Filbert, MD  CHIEF COMPLAINT:   Chief Complaint  Patient presents with  . Loss of Consciousness   Loss of consciousness today. HISTORY OF PRESENT ILLNESS:  Courtney [name]  is a 31 y.o. female with no past medical history. She presently ED with the above chief complaints. She came to the ED for possible seizure activity 5 days ago. Workup was negative. She said she passed out while she was walking at work [employer]). She feels dizzy before she passed out. She said she might pass out about several minutes. She feels weak, diaphoresis, and palpitation after syncope. But she denies any incontinence. Her mother has concerns and she wants monitor and observation. Dr. Mayford Knife requested admission. She denies any alcohol or drug abuse. But she said she didn't eat breakfast. Her EKG is unremarkable, blood sugar is 84. No orthostatic hypotension per ER physician. PAST MEDICAL HISTORY:  History reviewed. No pertinent past medical history.  PAST SURGICAL HISTORY:  History reviewed. No pertinent surgical history.  SOCIAL HISTORY:   Social History  Substance Use Topics  . Smoking status: Never Smoker  . Smokeless tobacco: Never Used  . Alcohol use No     Comment: occ    FAMILY HISTORY:  History reviewed. No pertinent family history.  DRUG ALLERGIES:  No Known Allergies  REVIEW OF SYSTEMS:   Review of Systems  Constitutional: Negative for chills, fever and malaise/fatigue.  HENT: Negative for sore throat.   Eyes: Negative for blurred vision and double vision.  Respiratory: Negative for cough, hemoptysis, shortness of breath, wheezing and stridor.   Cardiovascular: Positive for palpitations. Negative for chest  pain, orthopnea and leg swelling.  Gastrointestinal: Negative for abdominal pain, blood in stool, diarrhea, melena, nausea and vomiting.  Genitourinary: Negative for dysuria, flank pain and hematuria.  Musculoskeletal: Negative for back pain and joint pain.  Neurological: Positive for dizziness, loss of consciousness and weakness. Negative for sensory change, focal weakness, seizures and headaches.  Endo/Heme/Allergies: Negative for polydipsia.  Psychiatric/Behavioral: Negative for depression. The patient is not nervous/anxious.     MEDICATIONS AT HOME:   Prior to Admission medications   Medication Sig Start Date End Date Taking? Authorizing Provider  Boric Acid POWD Place 1 suppository vaginally.   Yes [provider]  folic acid (FOLVITE) 800 MCG tablet Take 400 mcg by mouth daily.   Yes [provider]  Probiotic Product (PROBIOTIC DAILY) CAPS Take 1 capsule by mouth.   Yes [provider]  vitamin C (ASCORBIC ACID) 500 MG tablet Take 500 mg by mouth daily.   Yes [provider]  sulfamethoxazole-trimethoprim (BACTRIM DS,SEPTRA DS) 800-160 MG tablet Take 1 tablet by mouth 2 (two) times daily. Patient not taking: Reported on 07/19/2017 03/04/17   Evon Slack, PA-C  tobramycin (TOBREX) 0.3 % ophthalmic solution Place 2 drops into the left eye every 4 (four) hours. Patient not taking: Reported on 07/19/2017 10/17/16   Tommi Rumps, PA-C      VITAL SIGNS:  Blood pressure 108/66, pulse 64, temperature 98.2 F (36.8 C), temperature source Oral, resp. rate 17, height 5\' 6"  (1.676 m), weight 143 lb (64.9 kg), last menstrual period 07/22/2017, SpO2 99 %.  PHYSICAL EXAMINATION:  Physical Exam  GENERAL:  31 y.o.-year-old patient lying in the bed with no acute distress.  EYES: Pupils equal, round, reactive to light and accommodation. No scleral icterus. Extraocular muscles intact.  HEENT: Head atraumatic, normocephalic. Oropharynx and nasopharynx  clear.  NECK:  Supple, no jugular venous distention. No thyroid enlargement, no tenderness.  LUNGS: Normal breath sounds bilaterally, no wheezing, rales,rhonchi or crepitation. No use of accessory muscles of respiration.  CARDIOVASCULAR: S1, S2 normal. No murmurs, rubs, or gallops.  ABDOMEN: Soft, nontender, nondistended. Bowel sounds present. No organomegaly or mass.  EXTREMITIES: No pedal edema, cyanosis, or clubbing.  NEUROLOGIC: Cranial nerves II through XII are intact. Muscle strength 5/5 in all extremities. Sensation intact. Gait not checked.  PSYCHIATRIC: The patient is alert and oriented x 3.  SKIN: No obvious rash, lesion, or ulcer.   LABORATORY PANEL:   CBC  Recent Labs Lab 07/23/17 1131  WBC 4.9  HGB 12.4  HCT 35.8  PLT 201   ------------------------------------------------------------------------------------------------------------------  Chemistries   Recent Labs Lab 07/23/17 1131  NA 139  K 3.7  CL 106  CO2 23  GLUCOSE 84  BUN 7  CREATININE 0.73  CALCIUM 9.0  AST 24  ALT 20  ALKPHOS 53  BILITOT 0.7   ------------------------------------------------------------------------------------------------------------------  Cardiac Enzymes  Recent Labs Lab 07/23/17 1131  TROPONINI <0.03   ------------------------------------------------------------------------------------------------------------------  RADIOLOGY:  Dg Chest Port 1 View  Result Date: 07/23/2017 CLINICAL DATA:  Syncope, possible seizure EXAM: PORTABLE CHEST 1 VIEW COMPARISON:  11/13/2015 FINDINGS: Heart and mediastinal contours are within normal limits. No focal opacities or effusions. No acute bony abnormality. IMPRESSION: No active disease. Electronically Signed   By: Charlett NoseKevin  Dover M.D.   On: 07/23/2017 12:15      IMPRESSION AND PLAN:   Syncope, unclear etiology. The patient will be placed for observation. Continued telemetry monitor, echocardiogram and cardiology consult. Advised  the patient eat breakfast.  All the records are reviewed and case discussed with ED provider. Management plans discussed with the patient, her mother and they are in agreement.  CODE STATUS: Full code  TOTAL TIME TAKING CARE OF THIS PATIENT: 42 minutes.    Shaune Pollackhen, Logann Whitebread M.D on 07/23/2017 at 2:53 PM  Between 7am to 6pm - Pager - [phone]  After 6pm go to [email or web address] - Scientist, research (life sciences)password [email or web address]  [facility] [facility]  Office  [phone]  CC: Primary care physician; [facility]   Note: This dictation was prepared with Nurse, children'sDragon dictation along with smaller phrase technology. Any transcriptional errors that result from this process are unin

## 2017-07-23 NOTE — ED Notes (Signed)
Pt tolerated ambulation to restroom well. Pt stating that she is just feeling "weak."

## 2017-07-23 NOTE — ED Triage Notes (Signed)
Pt was seen here Saturday for possible new onset seizures. Pt has not been able to follow-up from previous visit. Pt stating that she was at work and became dizzy and LOC. Pt stating that she had a HA at the time, but it has resolved. Pt stating that she is currently feeling weak, but has no pain.

## 2017-07-23 NOTE — Progress Notes (Signed)
31yo bf admitted to room 232 from ED with syncope.  A&O x3, gait unsteady, pt states she feels "weak".  No distress on ra.  Cardiac monitor placed on pt and verified with Abby, CNA.  Denies chest pain.  Lungs clear bil.  Abdomen benign.  Skin intact. SL rt ac flushes well.  Oriented to room and surroundings, POC reviewed with pt.  CB in reach, SR up x 2.

## 2017-07-23 NOTE — ED Provider Notes (Addendum)
[facility] Emergency Department Provider Note       Time seen: ----------------------------------------- 11:25 AM on 07/23/2017 -----------------------------------------     I have reviewed the triage vital signs and the nursing notes.   HISTORY   Chief Complaint Loss of Consciousness    HPI Courtney [name] is a 31 y.o. female with no known past medical history but with a recent seizure who presents to the ED for syncope today.  Patient states she was at work and was not feeling well and was feeling dizzy.  Patient states she did not hurt herself during the event.  She reports somewhat that she had a headache at that time but it is resolving.  She has not eaten breakfast, reports she has not followed up since her last visit and possible seizure.  She denies any recent illness or other complaints.  History reviewed. No pertinent past medical history.  There are no active problems to display for this patient.   History reviewed. No pertinent surgical history.  Allergies Patient has no known allergies.  Social History Social History  Substance Use Topics  . Smoking status: Never Smoker  . Smokeless tobacco: Never Used  . Alcohol use No     Comment: occ   Review of Systems Constitutional: Negative for fever. Eyes: Negative for vision changes ENT:  Negative for congestion, sore throat Cardiovascular: Negative for chest pain. Respiratory: Negative for shortness of breath. Gastrointestinal: Negative for abdominal pain, vomiting and diarrhea. Genitourinary: Negative for dysuria. Musculoskeletal: Negative for back pain. Skin: Negative for rash. Neurological: Positive for recent headache, positive for weakness  All systems negative/normal/unremarkable except as stated in the HPI  ____________________________________________   PHYSICAL EXAM:  VITAL SIGNS: ED Triage Vitals  Enc Vitals Group     BP 07/23/17 1112 114/70     Pulse Rate  07/23/17 1112 73     Resp 07/23/17 1112 20     Temp 07/23/17 1112 98.2 F (36.8 C)     Temp Source 07/23/17 1112 Oral     SpO2 07/23/17 1112 99 %     Weight 07/23/17 1119 143 lb (64.9 kg)     Height 07/23/17 1119 5\' 6"  (1.676 m)     Head Circumference --      Peak Flow --      Pain Score 07/23/17 1112 0     Pain Loc --      Pain Edu? --      Excl. in GC? --     Constitutional: Alert and oriented. Well appearing and in no distress. Eyes: Conjunctivae are normal. Normal extraocular movements. ENT   Head: Normocephalic and atraumatic.   Nose: No congestion/rhinnorhea.   Mouth/Throat: Mucous membranes are moist.   Neck: No stridor. Cardiovascular: Normal rate, regular rhythm. No murmurs, rubs, or gallops. Respiratory: Normal respiratory effort without tachypnea nor retractions. Breath sounds are clear and equal bilaterally. No wheezes/rales/rhonchi. Gastrointestinal: Soft and nontender. Normal bowel sounds Musculoskeletal: Nontender with normal range of motion in extremities. No lower extremity tenderness nor edema. Neurologic:  Normal speech and language. No gross focal neurologic deficits are appreciated.  Skin:  Skin is warm, dry and intact. No rash noted. Psychiatric: Mood and affect are normal. Speech and behavior are normal.  ____________________________________________  EKG: Interpreted by me.  Sinus rhythm rate of 65 bpm, premature atrial contraction, normal QRS size, normal QT, normal axis  ____________________________________________  ED COURSE:  Pertinent labs & imaging results that were available during my care of  the patient were reviewed by me and considered in my medical decision making (see chart for details). Patient presents for syncope, we will assess with labs and imaging as indicated.  I am a little concerned that this is the second episode that she has had in the last week   Procedures ____________________________________________   LABS  (pertinent positives/negatives)  Labs Reviewed  CBC - Abnormal; Notable for the following:       Result Value   RBC 3.77 (*)    All other components within normal limits  TROPONIN I  COMPREHENSIVE METABOLIC PANEL  URINE DRUG SCREEN, QUALITATIVE ([facility] ONLY)  TSH  URINALYSIS, COMPLETE (UACMP) WITH MICROSCOPIC  POC URINE PREG, ED  POCT PREGNANCY, URINE    RADIOLOGY  Chest x-ray IMPRESSION: No active disease.  ____________________________________________  DIFFERENTIAL DIAGNOSIS   Seizure, vasovagal syncope, idiopathic syncope, orthostatic hypotension, electrolyte abnormality, arrhythmia  FINAL ASSESSMENT AND PLAN  Syncope   Plan: Patient had presented for syncope. Patients labs were reassuring and she was not orthostatic here clinically.  Currently she is on her menses which is the reason for hematuria.  Patients imaging was also reassuring.  This is now her second syncopal event in the past week.  I think it would be wise to admit her, keep her on telemetry and evaluate her for syncope versus seizure.  I will discussed with the hospitalist for admission.   Emily Filbert, MD   Note: This note was generated in part or whole with voice recognition software. Voice recognition is usually quite accurate but there are transcription errors that can and very often do occur. I apologize for any typographical errors that were not detected and corrected.     Emily Filbert, MD 07/23/17 1324    Emily Filbert, MD 07/23/17 [phone]

## 2017-07-24 ENCOUNTER — Observation Stay (HOSPITAL_BASED_OUTPATIENT_CLINIC_OR_DEPARTMENT_OTHER): Payer: Self-pay

## 2017-07-24 ENCOUNTER — Telehealth: Payer: Self-pay | Admitting: *Deleted

## 2017-07-24 ENCOUNTER — Encounter: Payer: Self-pay | Admitting: *Deleted

## 2017-07-24 DIAGNOSIS — R55 Syncope and collapse: Secondary | ICD-10-CM

## 2017-07-24 DIAGNOSIS — R569 Unspecified convulsions: Secondary | ICD-10-CM

## 2017-07-24 DIAGNOSIS — R61 Generalized hyperhidrosis: Secondary | ICD-10-CM

## 2017-07-24 LAB — TROPONIN I: Troponin I: <0.03 ng/mL (ref <0.03)

## 2017-07-24 LAB — MAGNESIUM: Magnesium: 2 mg/dL (ref 1.7–2.4)

## 2017-07-24 LAB — ECHOCARDIOGRAM COMPLETE
HEIGHTINCHES: 66 in
WEIGHTICAEL: 2288 [oz_av]

## 2017-07-24 NOTE — Telephone Encounter (Signed)
Event monitor ordered by Dr. Mariah MillingGollan. Order entered and will enter in system for monitor to be shipped to the patient.

## 2017-07-24 NOTE — Progress Notes (Signed)
[facility] - [place] at [facility]   PATIENT NAME: Courtney [name]    MR#:  [id number]  DATE OF BIRTH:  1986-09-16  SUBJECTIVE:   No syncope overnight  REVIEW OF SYSTEMS:    Review of Systems  Constitutional: Negative for fever, chills weight loss HENT: Negative for ear pain, nosebleeds, congestion, facial swelling, rhinorrhea, neck pain, neck stiffness and ear discharge.   Respiratory: Negative for cough, shortness of breath, wheezing  Cardiovascular: Negative for chest pain, palpitations and leg swelling.  Gastrointestinal: Negative for heartburn, abdominal pain, vomiting, diarrhea or consitpation Genitourinary: Negative for dysuria, urgency, frequency, hematuria Musculoskeletal: Negative for back pain or joint pain Neurological: Negative for dizziness, seizures, syncope, focal weakness,  numbness and headaches.  Hematological: Does not bruise/bleed easily.  Psychiatric/Behavioral: Negative for hallucinations, confusion, dysphoric mood    Tolerating Diet: yes      DRUG ALLERGIES:  No Known Allergies  VITALS:  Blood pressure 103/71, pulse 65, temperature 98.4 F (36.9 C), temperature source Oral, resp. rate 20, height 5\' 6"  (1.676 m), weight 64.9 kg (143 lb), last menstrual period 07/22/2017, SpO2 100 %.  PHYSICAL EXAMINATION:  Constitutional: Appears well-developed and well-nourished. No distress. HENT: Normocephalic. Marland Kitchen. Oropharynx is clear and moist.  Eyes: Conjunctivae and EOM are normal. PERRLA, no scleral icterus.  Neck: Normal ROM. Neck supple. No JVD. No tracheal deviation. CVS: RRR, S1/S2 +, no murmurs, no gallops, no carotid bruit.  Pulmonary: Effort and breath sounds normal, no stridor, rhonchi, wheezes, rales.  Abdominal: Soft. BS +,  no distension, tenderness, rebound or guarding.  Musculoskeletal: Normal range of motion. No edema and no tenderness.  Neuro: Alert. CN 2-12 grossly intact. No focal deficits. Skin: Skin is warm and dry. No rash  noted. Psychiatric: Normal mood and affect.      LABORATORY PANEL:   CBC  Recent Labs Lab 07/23/17 1131  WBC 4.9  HGB 12.4  HCT 35.8  PLT 201   ------------------------------------------------------------------------------------------------------------------  Chemistries   Recent Labs Lab 07/23/17 1131 07/24/17 0814  NA 139  --   K 3.7  --   CL 106  --   CO2 23  --   GLUCOSE 84  --   BUN 7  --   CREATININE 0.73  --   CALCIUM 9.0  --   MG  --  2.0  AST 24  --   ALT 20  --   ALKPHOS 53  --   BILITOT 0.7  --    ------------------------------------------------------------------------------------------------------------------  Cardiac Enzymes  Recent Labs Lab 07/23/17 1131 07/24/17 0814  TROPONINI <0.03 <0.03   ------------------------------------------------------------------------------------------------------------------  RADIOLOGY:  Dg Chest Port 1 View  Result Date: 07/23/2017 CLINICAL DATA:  Syncope, possible seizure EXAM: PORTABLE CHEST 1 VIEW COMPARISON:  11/13/2015 FINDINGS: Heart and mediastinal contours are within normal limits. No focal opacities or effusions. No acute bony abnormality. IMPRESSION: No active disease. Electronically Signed   By: Charlett NoseKevin  Dover M.D.   On: 07/23/2017 12:15     ASSESSMENT AND PLAN:   31 year old female who presents with recurrent syncope.  1. Recurrent syncope: This seems to be vasovagal in nature. Patient however will need 30 day event monitor. Telemetry shows no arrhythmia or pauses. She was seen and evaluated cardiology. Neurology consult requested and EEG ordered. I do not think she  needs carotids at t his time.     Management plans discussed with the patient and she is in agreement.  CODE STATUS: full  TOTAL TIME TAKING CARE OF THIS PATIENT:  30 minutes.     POSSIBLE D/C today, DEPENDING ON CLINICAL CONDITION.   Caoimhe Damron M.D on 07/24/2017 at 11:00 AM  Between 7am to 6pm - Pager -  [phone] After 6pm go to [email or web address] - Social research officer, government  [facility] [place] [facility]  Office  [phone]  CC: Primary care physician; Center, [facility]  Note: This dictation was prepared with Nurse, children's dictation along with smaller phrase technology. Any transcriptional errors that result from this process are unintentional.

## 2017-07-24 NOTE — Consult Note (Signed)
Cardiology Consultation:   Patient ID: Courtney [name]; [id number]; Aug 22, 1986   Admit date: 07/23/2017 Date of Consult: 07/24/2017  Primary Care Provider: Center, [facility] Primary Cardiologist: New to [facility] - consult by Mariah Milling   Patient Profile:   Courtney [name] is a 31 y.o. female with a hx of prior syncope in the setting of anemia with associated heavy periods who is being seen today for the evaluation of syncope at the request of Dr. Imogene Burn.  History of Present Illness:   Courtney [name] has no previously known cardiac history. She had a syncopal vs seizure-like episode at age 43 that was apparently felt to ultimately be 2/2 anemia in the setting of heavy menses. She had not had any further episodes until 10/27 when she had sudden onset syncope while in a hot shower with associated seizure-like activity. There was associated post ictal state. She describes feeling sharp chest pain and associated palpitations preceding this event. She was brought to the [facility] ED where head CT was not acute, CBC was unremarkable, SCr 0.78, K+ 3.9, HCG < 1, unrevealing LFT. EKG not on file for review. Vital signs are not documented for review. She was discharged from the ED with outpatient follow up. She was at work at [employer] on 10/31 when she developed sudden onset of sharp chest pain and palpitations. This was followed by sudden onset of dizziness with presyncope vs syncope. She does not think she fully suffered LOC. She did not hit her head or suffer any injury. There was no associated seizure-like activity with the episode on 10/31. No associated post ictal state. She did not suffer loss of bowel or bladder function with either episode. She was brought to the ED where her vitals signs were normal EKG was not acute as below. CXR negative. Head imaging was not repeated. Labs showed troponin negative x 2, magnesium 2.0, normal TSH, HCG negative, UDS negative, unremarkable cmet, WBC 4.9, HGB 12.4,  PLT 201. UA contaminated with menstrual flow. Telemetry has not shown any significant arrhythmia or pause as detailed below. She denies drinking any caffeine, using tobacco, or illegal drugs. She rarely drinks alcohol. There is no family history of sudden death. No prior known cardiac family history. She has been asymptomatic since her arrival to the ED.    Past Medical History:  Diagnosis Date  . Menorrhagia     History reviewed. No pertinent surgical history.   Home Meds: Prior to Admission medications   Medication Sig Start Date End Date Taking? Authorizing Provider  Boric Acid POWD Place 1 suppository vaginally.   Yes [provider]  folic acid (FOLVITE) 800 MCG tablet Take 400 mcg by mouth daily.   Yes [provider]  Probiotic Product (PROBIOTIC DAILY) CAPS Take 1 capsule by mouth.   Yes [provider]  vitamin C (ASCORBIC ACID) 500 MG tablet Take 500 mg by mouth daily.   Yes [provider]  sulfamethoxazole-trimethoprim (BACTRIM DS,SEPTRA DS) 800-160 MG tablet Take 1 tablet by mouth 2 (two) times daily. Patient not taking: Reported on 07/19/2017 03/04/17   Evon Slack, PA-C  tobramycin (TOBREX) 0.3 % ophthalmic solution Place 2 drops into the left eye every 4 (four) hours. Patient not taking: Reported on 07/19/2017 10/17/16   Tommi Rumps, PA-C    Inpatient Medications: Scheduled Meds: . enoxaparin (LOVENOX) injection  40 mg Subcutaneous Q24H  . sodium chloride flush  3 mL Intravenous Q12H   Continuous Infusions: .  sodium chloride     PRN Meds: sodium chloride, acetaminophen **OR** acetaminophen, albuterol, ondansetron **OR** ondansetron (ZOFRAN) IV, oxyCODONE, sodium chloride flush  Allergies:  No Known Allergies  Social History:   Social History   Social History  . Marital status: Single    Spouse name: N/A  . Number of children: N/A  . Years of education: N/A   Occupational History  . Not on file.   Social  History Main Topics  . Smoking status: Never Smoker  . Smokeless tobacco: Never Used  . Alcohol use No     Comment: occ  . Drug use: No  . Sexual activity: Yes    Birth control/ protection: Condom   Other Topics Concern  . Not on file   Social History Narrative  . No narrative on file     Family History:   Family History  Problem Relation Age of Onset  . Hypertension Mother     ROS:  Review of Systems  Constitutional: Positive for malaise/fatigue. Negative for chills, diaphoresis, fever and weight loss.  HENT: Negative for congestion.   Eyes: Negative for discharge and redness.  Respiratory: Negative for cough, hemoptysis, sputum production, shortness of breath and wheezing.   Cardiovascular: Positive for chest pain and palpitations. Negative for orthopnea, claudication, leg swelling and PND.  Gastrointestinal: Negative for abdominal pain, blood in stool, heartburn, melena, nausea and vomiting.  Genitourinary: Negative for hematuria.  Musculoskeletal: Positive for falls. Negative for myalgias.  Skin: Negative for rash.  Neurological: Positive for dizziness, loss of consciousness and weakness. Negative for tingling, tremors, sensory change, speech change, focal weakness, seizures and headaches.  Endo/Heme/Allergies: Does not bruise/bleed easily.  Psychiatric/Behavioral: Negative for substance abuse. The patient is not nervous/anxious.   All other systems reviewed and are negative.     Physical Exam/Data:   Vitals:   07/23/17 1530 07/23/17 1600 07/23/17 1925 07/24/17 0427  BP: 109/68 106/61 (!) 105/59 99/60  Pulse: 67 70 83 63  Resp: (!) 21 18 18 18   Temp:  98.6 F (37 C) 99.1 F (37.3 C) 98.6 F (37 C)  TempSrc:  Oral Oral Oral  SpO2: 98% 98% 100% 100%  Weight:      Height:        Intake/Output Summary (Last 24 hours) at 07/24/17 0924 Last data filed at 07/23/17 2036  Gross per 24 hour  Intake              243 ml  Output                0 ml  Net               243 ml   Filed Weights   07/23/17 1119  Weight: 143 lb (64.9 kg)   Body mass index is 23.08 kg/m.   Physical Exam: General: Well developed, well nourished, in no acute distress. Head: Normocephalic, atraumatic, sclera non-icteric, no xanthomas, nares without discharge. Neck: Negative for carotid bruits. JVD not elevated. Lungs: Clear bilaterally to auscultation without wheezes, rales, or rhonchi. Breathing is unlabored. Heart: RRR with S1 S2. No murmurs, rubs, or gallops appreciated. Abdomen: Soft, non-tender, non-distended with normoactive bowel sounds. No hepatomegaly. No rebound/guarding. No obvious abdominal masses. Msk:  Strength and tone appear normal for age. Extremities: No clubbing or cyanosis. No edema. Distal pedal pulses are 2+ and equal bilaterally. Neuro: Alert and oriented X 3. No facial asymmetry. No focal deficit. Moves all extremities spontaneously. Psych:  Responds to questions appropriately with  a normal affect.   EKG:  The EKG was personally reviewed and demonstrates: NSR, 65 bpm, rare PAC, no acute st/t changes Telemetry:  Telemetry was personally reviewed and demonstrates: sinus bradycardia to sinsu rhythm with heart rates in the upper 50s to 80s bpm, rare PAC  Weights: Filed Weights   07/23/17 1119  Weight: 143 lb (64.9 kg)    Relevant CV Studies: Echo pending  Laboratory Data:  Chemistry  Recent Labs Lab 07/19/17 1255 07/23/17 1131  NA 139 139  K 3.9 3.7  CL 108 106  CO2 23 23  GLUCOSE 88 84  BUN 10 7  CREATININE 0.78 0.73  CALCIUM 9.2 9.0  GFRNONAA >60 >60  GFRAA >60 >60  ANIONGAP 8 10     Recent Labs Lab 07/19/17 1255 07/23/17 1131  PROT 7.5 7.9  ALBUMIN 4.0 4.1  AST 21 24  ALT 15 20  ALKPHOS 46 53  BILITOT 1.1 0.7   Hematology  Recent Labs Lab 07/19/17 1255 07/23/17 1131  WBC 6.2 4.9  RBC 3.81 3.77*  HGB 12.5 12.4  HCT 35.9 35.8  MCV 94.4 95.0  MCH 32.9 33.0  MCHC 34.9 34.7  RDW 12.8 12.8  PLT 213 201    Cardiac Enzymes  Recent Labs Lab 07/23/17 1131 07/24/17 0814  TROPONINI <0.03 <0.03   No results for input(s): TROPIPOC in the last 168 hours.  BNPNo results for input(s): BNP, PROBNP in the last 168 hours.  DDimer No results for input(s): DDIMER in the last 168 hours.  Radiology/Studies:  Dg Chest Port 1 View  Result Date: 07/23/2017 IMPRESSION: No active disease. Electronically Signed   By: Charlett Nose M.D.   On: 07/23/2017 12:15    Assessment and Plan:   1. Recurrent syncope: -Both episodes have been associated with the patient being on her period -She has known syncope associated with her period  -Episode on 10/27 was in the setting of a hot shower and likely vasovagal  -Episode on 10/31 does not sound like she actually suffered a syncopal episode and was more so described as dizziness, palpitations, and presyncope -Etiology remains uncertain, though is likely 2/2 her heavy periods and increased vagal tone -No concerning arrhythmias or pauses on telemetry  -Echo pending -If no arrhythmia or pause is noted on telemetry would recommend 30-day event monitor and follow up with EP -Continue to cycle troponin to rule out -No carotid bruit on exam, unlikely to be carotid artery disease in a 31 year old -TSH normal, lytes at goal -Ambulate with PT -Recommend neurology input given possible post ictal state with associated tremor  2. Menorrhagia: -Per IM/PCP    For questions or updates, please contact [facility] Please consult [email or web address] for contact info under Cardiology/STEMI.   Signed, Eula Listen, PA-C [facility] Pager: [phone] 07/24/2017, 9:24 AM

## 2017-07-24 NOTE — Plan of Care (Signed)
Problem: Education: Goal: Knowledge of [place] [facility] Education information/materials will improve Outcome: Completed/Met Date Met: 07/24/17 Complaints of period cramps, treated with tylenol, with relief. Up independently, tolerated well. Echo has been completed. Cardiology to see pt today.

## 2017-07-24 NOTE — Telephone Encounter (Signed)
Left voicemail message for patient regarding monitor ordered and that she will receive a call to verify address with instructions to please call back if she should have any further questions.

## 2017-07-24 NOTE — Progress Notes (Signed)
[facility] - [place] at [facility]    Courtney [name] was admitted to the Hospital on 07/23/2017 and Discharged  07/24/2017 and should be excused from work/school   for 2  days starting 07/23/2017 , may return to work/school without any restrictions.  Call Auburn BilberryShreyang Falon Flinchum MD with questions.  Auburn BilberryPATEL, Keara Pagliarulo M.D on 07/24/2017,at 5:36 PM  [facility] - [place] at [facility]    Office  [phone]

## 2017-07-24 NOTE — Telephone Encounter (Signed)
-----   Message from Coralee RudSabrina F Gilley sent at 07/24/2017 10:41 AM EDT ----- Regarding: needs 30 day monitor [facility], Dr. Mariah MillingGollan ordered 30 day monitor for this pt

## 2017-07-24 NOTE — Progress Notes (Signed)
Patient unable to undergo MRI due to nipple piercing's as she is unable to remove them. Neurologist made aware. Per neuro, ok for patient to discharge but will need to follow up outpatient.

## 2017-07-24 NOTE — Consult Note (Signed)
Reason for Consult:Syncope Referring Physician: Mody  CC: Syncope  HPI: Courtney [name] is an 31 y.o. female admit admitted after a syncopal episode.  Per the patient on Saturday she was in the shower.  She began to have chest pain and dizziness.  Then had a syncopal event that seemed to be associated with seizure-like activity.  There was no tongue biting.  There was no bowel or bladder incontinence.  Patient was brought to the emergency room at that time.  Blood work was unremarkable.  Head CT was performed that was unremarkable as well.  Patient was discharged home.  On yesterday while at work again had a episode of chest pain and dizziness.  She does report being syncopal at that time as well.  It does it is not clear if she had any seizure-like activity.  Patient was admitted at that time.  Lab work again was unremarkable.  Patient has been evaluated by cardiology and no cardiac abnormalities have been noted.  Telemetry monitoring has been unremarkable.  Patient does have a history at 31 years old of having a syncopal episode associated with her menses and being anemic.  Patient was menstruating with with both of the most recent events as well.  Patient is not significantly anemic.  Unclear etiology for episodes.   No history of significant head injury, birth complications, encephalitis, meningitis, or family history of seizures.  Past Medical History:  Diagnosis Date  . Menorrhagia     History reviewed. No pertinent surgical history.  Family History  Problem Relation Age of Onset  . Hypertension Mother     Social History:  reports that she has never smoked. She has never used smokeless tobacco. She reports that she does not drink alcohol or use drugs.  No Known Allergies  Medications:  I have reviewed the patient's current medications. Prior to Admission:  Prescriptions Prior to Admission  Medication Sig Dispense Refill Last Dose  . Boric Acid POWD Place 1 suppository vaginally.    Past Month at Unknown time  . folic acid (FOLVITE) 800 MCG tablet Take 400 mcg by mouth daily.   Past Week at Unknown time  . Probiotic Product (PROBIOTIC DAILY) CAPS Take 1 capsule by mouth.   Past Week at Unknown time  . vitamin C (ASCORBIC ACID) 500 MG tablet Take 500 mg by mouth daily.   Past Week at Unknown time  . sulfamethoxazole-trimethoprim (BACTRIM DS,SEPTRA DS) 800-160 MG tablet Take 1 tablet by mouth 2 (two) times daily. (Patient not taking: Reported on 07/19/2017) 14 tablet 0 Completed Course at Unknown time  . tobramycin (TOBREX) 0.3 % ophthalmic solution Place 2 drops into the left eye every 4 (four) hours. (Patient not taking: Reported on 07/19/2017) 5 mL 0 Completed Course at Unknown time   Scheduled: . enoxaparin (LOVENOX) injection  40 mg Subcutaneous Q24H  . sodium chloride flush  3 mL Intravenous Q12H    ROS: History obtained from the patient  General ROS: negative for - chills, fatigue, fever, night sweats, weight gain or weight loss Psychological ROS: negative for - behavioral disorder, hallucinations, memory difficulties, mood swings or suicidal ideation Ophthalmic ROS: negative for - blurry vision, double vision, eye pain or loss of vision ENT ROS: negative for - epistaxis, nasal discharge, oral lesions, sore throat, tinnitus  Allergy and Immunology ROS: negative for - hives or itchy/watery eyes Hematological and Lymphatic ROS: negative for - bleeding problems, bruising or swollen lymph nodes Endocrine ROS: negative for - galactorrhea, hair pattern  changes, polydipsia/polyuria or temperature intolerance Respiratory ROS: negative for - cough, hemoptysis, shortness of breath or wheezing Cardiovascular ROS: As noted in HPI Gastrointestinal ROS: negative for - abdominal pain, diarrhea, hematemesis, nausea/vomiting or stool incontinence Genito-Urinary ROS: negative for - dysuria, hematuria, incontinence or urinary frequency/urgency Musculoskeletal ROS: negative for -  joint swelling or muscular weakness Neurological ROS: as noted in HPI Dermatological ROS: negative for rash and skin lesion changes  Physical Examination: Blood pressure 103/71, pulse 65, temperature 98.4 F (36.9 C), temperature source Oral, resp. rate 20, height 5\' 6"  (1.676 m), weight 64.9 kg (143 lb), last menstrual period 07/22/2017, SpO2 100 %.  HEENT-  Normocephalic, no lesions, without obvious abnormality.  Normal external eye and conjunctiva.  Normal TM's bilaterally.  Normal auditory canals and external ears. Normal external nose, mucus membranes and septum.  Normal pharynx. Cardiovascular- S1, S2 normal, pulses palpable throughout   Lungs- chest clear, no wheezing, rales, normal symmetric air entry Abdomen- soft, non-tender; bowel sounds normal; no masses,  no organomegaly Extremities- no edema Lymph-no adenopathy palpable Musculoskeletal-no joint tenderness, deformity or swelling Skin-warm and dry, no hyperpigmentation, vitiligo, or suspicious lesions  Neurological Examination   Mental Status: Alert, oriented, thought content appropriate.  Speech fluent without evidence of aphasia.  Able to follow 3 step commands without difficulty. Cranial Nerves: II: Discs flat bilaterally; Visual fields grossly normal, pupils equal, round, reactive to light and accommodation III,IV, VI: ptosis not present, extra-ocular motions intact bilaterally V,VII: smile symmetric, facial light touch sensation normal bilaterally VIII: hearing normal bilaterally IX,X: gag reflex present XI: bilateral shoulder shrug XII: midline tongue extension Motor: Right : Upper extremity   5/5    Left:     Upper extremity   5/5  Lower extremity   5/5     Lower extremity   5/5 Tone and bulk:normal tone throughout; no atrophy noted Sensory: Pinprick and light touch intact throughout, bilaterally Deep Tendon Reflexes: 2+ and symmetric throughout Plantars: Right: downgoing   Left: downgoing Cerebellar: normal  finger-to-nose, normal rapid alternating movements and normal heel-to-shin test Gait: normal gait and station    Laboratory Studies:   Basic Metabolic Panel:  Recent Labs Lab 07/19/17 1255 07/23/17 1131 07/24/17 0814  NA 139 139  --   K 3.9 3.7  --   CL 108 106  --   CO2 23 23  --   GLUCOSE 88 84  --   BUN 10 7  --   CREATININE 0.78 0.73  --   CALCIUM 9.2 9.0  --   MG  --   --  2.0    Liver Function Tests:  Recent Labs Lab 07/19/17 1255 07/23/17 1131  AST 21 24  ALT 15 20  ALKPHOS 46 53  BILITOT 1.1 0.7  PROT 7.5 7.9  ALBUMIN 4.0 4.1   No results for input(s): LIPASE, AMYLASE in the last 168 hours. No results for input(s): AMMONIA in the last 168 hours.  CBC:  Recent Labs Lab 07/19/17 1255 07/23/17 1131  WBC 6.2 4.9  NEUTROABS 4.0  --   HGB 12.5 12.4  HCT 35.9 35.8  MCV 94.4 95.0  PLT 213 201    Cardiac Enzymes:  Recent Labs Lab 07/23/17 1131 07/24/17 0814  TROPONINI <0.03 <0.03    BNP: Invalid input(s): POCBNP  CBG: No results for input(s): GLUCAP in the last 168 hours.  Microbiology: Results for orders placed or performed during the hospital encounter of 03/04/17  Wet prep, genital     Status:  Abnormal   Collection Time: 03/04/17  8:27 PM  Result Value Ref Range Status   Yeast Wet Prep HPF POC NONE SEEN NONE SEEN Final   Trich, Wet Prep NONE SEEN NONE SEEN Final   Clue Cells Wet Prep HPF POC NONE SEEN NONE SEEN Final   WBC, Wet Prep HPF POC MANY (A) NONE SEEN Final   Sperm NONE SEEN  Final  Chlamydia/NGC rt PCR     Status: None   Collection Time: 03/04/17  8:27 PM  Result Value Ref Range Status   Specimen source GC/Chlam ENDOCERVICAL  Final   Chlamydia Tr NOT DETECTED NOT DETECTED Final   N gonorrhoeae NOT DETECTED NOT DETECTED Final    Comment: (NOTE) 100  This methodology has not been evaluated in pregnant women or in 200  patients with a history of hysterectomy. 300 400  This methodology will not be performed on patients  less than 50  years of age.     Coagulation Studies: No results for input(s): LABPROT, INR in the last 72 hours.  Urinalysis:  Recent Labs Lab 07/23/17 1131  COLORURINE RED*  LABSPEC 1.032*  PHURINE TEST NOT REPORTED DUE TO COLOR INTERFERENCE OF URINE PIGMENT  GLUCOSEU TEST NOT REPORTED DUE TO COLOR INTERFERENCE OF URINE PIGMENT*  HGBUR TEST NOT REPORTED DUE TO COLOR INTERFERENCE OF URINE PIGMENT*  BILIRUBINUR TEST NOT REPORTED DUE TO COLOR INTERFERENCE OF URINE PIGMENT*  KETONESUR TEST NOT REPORTED DUE TO COLOR INTERFERENCE OF URINE PIGMENT*  PROTEINUR TEST NOT REPORTED DUE TO COLOR INTERFERENCE OF URINE PIGMENT*  NITRITE TEST NOT REPORTED DUE TO COLOR INTERFERENCE OF URINE PIGMENT*  LEUKOCYTESUR TEST NOT REPORTED DUE TO COLOR INTERFERENCE OF URINE PIGMENT*    Lipid Panel:  No results found for: CHOL, TRIG, HDL, CHOLHDL, VLDL, LDLCALC  HgbA1C: No results found for: HGBA1C  Urine Drug Screen:     Component Value Date/Time   LABOPIA NONE DETECTED 07/23/2017 1131   COCAINSCRNUR NONE DETECTED 07/23/2017 1131   LABBENZ NONE DETECTED 07/23/2017 1131   AMPHETMU NONE DETECTED 07/23/2017 1131   THCU NONE DETECTED 07/23/2017 1131   LABBARB NONE DETECTED 07/23/2017 1131    Alcohol Level: No results for input(s): ETH in the last 168 hours.   Imaging: Dg Chest Port 1 View  Result Date: 07/23/2017 CLINICAL DATA:  Syncope, possible seizure EXAM: PORTABLE CHEST 1 VIEW COMPARISON:  11/13/2015 FINDINGS: Heart and mediastinal contours are within normal limits. No focal opacities or effusions. No acute bony abnormality. IMPRESSION: No active disease. Electronically Signed   By: Charlett Nose M.D.   On: 07/23/2017 12:15     Assessment/Plan: 31 year old female admitted with recurrent syncope.  Etiology remains unclear.  Cardiology workup has been essentially unremarkable.  Unclear if this may be related to seizure since patient with a possible post ictal phenomenon after her event on  Saturday.  Patient with a normal neurological exam.  At this point am unable to make a clear diagnosis of epilepsy.  Patient may very well have had some seizure-like movements since patient did have a syncopal episode while standing and may not have gotten quickly to a lying position since she was in the shower.  Further workup recommended.  Head CT reviewed and shows no acute changes.  Recommendations: 1.  EEG 2.  MRI of the brain with and without contrast 3.  If above unremarkable patient may benefit from prolonged 48 to 72-hour EEG monitoring as an outpatient.  Anticonvulsant therapy would not be indicated at this time.  4.  Patient unable to drive, operate heavy machinery, perform activities at heights and participate in water activities until release by outpatient physician.   5.  Patient to follow-up with neurology on an outpatient basis   Thana Farr, MD Neurology [phone] 07/24/2017, 1:47 PM

## 2017-07-24 NOTE — Discharge Summary (Signed)
[facility] [place] [facility]   PATIENT NAME: Courtney [name]    MR#:  [id number]  DATE OF BIRTH:  Dec 04, 1985  DATE OF ADMISSION:  07/23/2017 ADMITTING PHYSICIAN: Shaune Pollack, MD  DATE OF DISCHARGE: 07/24/2017  PRIMARY CARE PHYSICIAN: [facility]    ADMISSION DIAGNOSIS:  Syncope, unspecified syncope type [R55]  DISCHARGE DIAGNOSIS:  Active Problems:   Syncope   SECONDARY DIAGNOSIS:   Past Medical History:  Diagnosis Date  . Menorrhagia     HOSPITAL COURSE:    31 year old female who presents with recurrent syncope.  1. Recurrent syncope: This seems to be vasovagal in nature. Patient however will need 30 day event monitor. Telemetry shows no arrhythmia or positives. She was seen and evaluated cardiology. She was also evaluated neurology. It is on clear if her symptoms are related to seizure. She underwent EEG. She was supposed to have an MRI but due to piercing she was unable to obtain MRI. She would benefit from follow-up by PCP and perhaps neurology as an outpatient.     DISCHARGE CONDITIONS AND DIET:  Stable regular diet  CONSULTS OBTAINED:  Treatment Team:  Iran Ouch, MD  DRUG ALLERGIES:  No Known Allergies  DISCHARGE MEDICATIONS:   Current Discharge Medication List    CONTINUE these medications which have NOT CHANGED   Details  Boric Acid POWD Place 1 suppository vaginally.    folic acid (FOLVITE) 800 MCG tablet Take 400 mcg by mouth daily.    Probiotic Product (PROBIOTIC DAILY) CAPS Take 1 capsule by mouth.    vitamin C (ASCORBIC ACID) 500 MG tablet Take 500 mg by mouth daily.      STOP taking these medications     sulfamethoxazole-trimethoprim (BACTRIM DS,SEPTRA DS) 800-160 MG tablet      tobramycin (TOBREX) 0.3 % ophthalmic solution           Today   CHIEF COMPLAINT:  Doing well this am  No events overnight   VITAL SIGNS:  Blood pressure 103/71, pulse 65, temperature 98.4 F  (36.9 C), temperature source Oral, resp. rate 20, height 5\' 6"  (1.676 m), weight 64.9 kg (143 lb), last menstrual period 07/22/2017, SpO2 100 %.   REVIEW OF SYSTEMS:  Review of Systems  Constitutional: Negative.  Negative for chills, fever and malaise/fatigue.  HENT: Negative.  Negative for ear discharge, ear pain, hearing loss, nosebleeds and sore throat.   Eyes: Negative.  Negative for blurred vision and pain.  Respiratory: Negative.  Negative for cough, hemoptysis, shortness of breath and wheezing.   Cardiovascular: Negative.  Negative for chest pain, palpitations and leg swelling.  Gastrointestinal: Negative.  Negative for abdominal pain, blood in stool, diarrhea, nausea and vomiting.  Genitourinary: Negative.  Negative for dysuria.  Musculoskeletal: Negative.  Negative for back pain.  Skin: Negative.   Neurological: Negative for dizziness, tremors, speech change, focal weakness, seizures and headaches.  Endo/Heme/Allergies: Negative.  Does not bruise/bleed easily.  Psychiatric/Behavioral: Negative.  Negative for depression, hallucinations and suicidal ideas.     PHYSICAL EXAMINATION:  GENERAL:  31 y.o.-year-old patient lying in the bed with no acute distress.  NECK:  Supple, no jugular venous distention. No thyroid enlargement, no tenderness.  LUNGS: Normal breath sounds bilaterally, no wheezing, rales,rhonchi  No use of accessory muscles of respiration.  CARDIOVASCULAR: S1, S2 normal. No murmurs, rubs, or gallops.  ABDOMEN: Soft, non-tender, non-distended. Bowel sounds present. No organomegaly or mass.  EXTREMITIES: No pedal edema, cyanosis, or clubbing.  PSYCHIATRIC:  The patient is alert and oriented x 3.  SKIN: No obvious rash, lesion, or ulcer.   DATA REVIEW:   CBC  Recent Labs Lab 07/23/17 1131  WBC 4.9  HGB 12.4  HCT 35.8  PLT 201    Chemistries   Recent Labs Lab 07/23/17 1131 07/24/17 0814  NA 139  --   K 3.7  --   CL 106  --   CO2 23  --   GLUCOSE  84  --   BUN 7  --   CREATININE 0.73  --   CALCIUM 9.0  --   MG  --  2.0  AST 24  --   ALT 20  --   ALKPHOS 53  --   BILITOT 0.7  --     Cardiac Enzymes  Recent Labs Lab 07/23/17 1131 07/24/17 0814  TROPONINI <0.03 <0.03    Microbiology Results  @[email or web address] @  RADIOLOGY:  Dg Chest Port 1 View  Result Date: 07/23/2017 CLINICAL DATA:  Syncope, possible seizure EXAM: PORTABLE CHEST 1 VIEW COMPARISON:  11/13/2015 FINDINGS: Heart and mediastinal contours are within normal limits. No focal opacities or effusions. No acute bony abnormality. IMPRESSION: No active disease. Electronically Signed   By: Charlett NoseKevin  Dover M.D.   On: 07/23/2017 12:15      Current Discharge Medication List    CONTINUE these medications which have NOT CHANGED   Details  Boric Acid POWD Place 1 suppository vaginally.    folic acid (FOLVITE) 800 MCG tablet Take 400 mcg by mouth daily.    Probiotic Product (PROBIOTIC DAILY) CAPS Take 1 capsule by mouth.    vitamin C (ASCORBIC ACID) 500 MG tablet Take 500 mg by mouth daily.      STOP taking these medications     sulfamethoxazole-trimethoprim (BACTRIM DS,SEPTRA DS) 800-160 MG tablet      tobramycin (TOBREX) 0.3 % ophthalmic solution             Management plans discussed with the patient and she is in agreement. Stable for discharge home  Patient should follow up with cardiology  CODE STATUS:     Code Status Orders        Start     Ordered   07/23/17 1636  Full code  Continuous     07/23/17 1635    Code Status History    Date Active Date Inactive Code Status Order ID Comments User Context   This patient has a current code status but no historical code status.      TOTAL TIME TAKING CARE OF THIS PATIENT: 37 minutes.    Note: This dictation was prepared with Dragon dictation along with smaller phrase technology. Any transcriptional errors that result from this process are unintentional.  Jordana Dugue M.D on 07/24/2017 at 10:03  AM  Between 7am to 6pm - Pager - [phone] After 6pm go to [email or web address] - Social research officer, governmentpassword EPAS [email or web address]  [facility]  Office  [phone]  CC: Primary care physician; [facility]

## 2017-07-25 ENCOUNTER — Telehealth: Payer: Self-pay | Admitting: Physician Assistant

## 2017-07-25 LAB — HIV ANTIBODY (ROUTINE TESTING W REFLEX): HIV Screen 4th Generation wRfx: NONREACTIVE

## 2017-07-25 NOTE — Telephone Encounter (Signed)
Courtney [name], [name], PA-C  [name], [name], RN        Can we get this patient set up for a 30-day event monitor? Likely discharge today. Dx: syncope/palpitations.    Home number disconnected. Left message on cell number to contact the office.

## 2017-07-29 NOTE — Telephone Encounter (Signed)
Left message on machine for patient to contact the office.   

## 2017-07-30 ENCOUNTER — Ambulatory Visit (INDEPENDENT_AMBULATORY_CARE_PROVIDER_SITE_OTHER): Payer: Self-pay

## 2017-07-30 DIAGNOSIS — R55 Syncope and collapse: Secondary | ICD-10-CM

## 2017-08-01 NOTE — Telephone Encounter (Signed)
Left message on pt's VM to contact the office. Letter mailed.

## 2017-08-01 NOTE — Progress Notes (Signed)
Letter mailed

## 2017-08-13 ENCOUNTER — Emergency Department
Admission: EM | Admit: 2017-08-13 | Discharge: 2017-08-13 | Disposition: A | Discharge status: Home / Self Care | Payer: Self-pay | Attending: Emergency Medicine | Admitting: Emergency Medicine

## 2017-08-13 ENCOUNTER — Encounter: Payer: Self-pay | Admitting: Emergency Medicine

## 2017-08-13 DIAGNOSIS — K5289 Other specified noninfective gastroenteritis and colitis: Secondary | ICD-10-CM | POA: Insufficient documentation

## 2017-08-13 DIAGNOSIS — Z79899 Other long term (current) drug therapy: Secondary | ICD-10-CM | POA: Insufficient documentation

## 2017-08-13 DIAGNOSIS — R112 Nausea with vomiting, unspecified: Secondary | ICD-10-CM

## 2017-08-13 DIAGNOSIS — K529 Noninfective gastroenteritis and colitis, unspecified: Secondary | ICD-10-CM

## 2017-08-13 DIAGNOSIS — R197 Diarrhea, unspecified: Secondary | ICD-10-CM

## 2017-08-13 LAB — URINALYSIS, COMPLETE (UACMP) WITH MICROSCOPIC
BACTERIA UA: NONE SEEN
BILIRUBIN URINE: NEGATIVE
GLUCOSE, UA: NEGATIVE mg/dL
HGB URINE DIPSTICK: NEGATIVE
KETONES UR: 5 mg/dL — AB
Leukocytes, UA: NEGATIVE
NITRITE: NEGATIVE
PROTEIN: NEGATIVE mg/dL
SPECIFIC GRAVITY, URINE: 1.027 (ref 1.005–1.030)
pH: 5 (ref 5.0–8.0)

## 2017-08-13 LAB — COMPREHENSIVE METABOLIC PANEL
ALBUMIN: 4.3 g/dL (ref 3.5–5.0)
ALT: 19 U/L (ref 14–54)
AST: 22 U/L (ref 15–41)
Alkaline Phosphatase: 53 U/L (ref 38–126)
Anion gap: 9 (ref 5–15)
BILIRUBIN TOTAL: 2.2 mg/dL — AB (ref 0.3–1.2)
BUN: 11 mg/dL (ref 6–20)
CO2: 23 mmol/L (ref 22–32)
Calcium: 9.1 mg/dL (ref 8.9–10.3)
Chloride: 106 mmol/L (ref 101–111)
Creatinine, Ser: 0.77 mg/dL (ref 0.44–1.00)
GFR calc Af Amer: >60 mL/min (ref >60)
GFR calc non Af Amer: >60 mL/min (ref >60)
GLUCOSE: 124 mg/dL — AB (ref 65–99)
POTASSIUM: 3.8 mmol/L (ref 3.5–5.1)
Sodium: 138 mmol/L (ref 135–145)
TOTAL PROTEIN: 7.9 g/dL (ref 6.5–8.1)

## 2017-08-13 LAB — CBC
HEMATOCRIT: 40.2 % (ref 35.0–47.0)
Hemoglobin: 13.7 g/dL (ref 12.0–16.0)
MCH: 32.1 pg (ref 26.0–34.0)
MCHC: 34.1 g/dL (ref 32.0–36.0)
MCV: 94.1 fL (ref 80.0–100.0)
Platelets: 222 10*3/uL (ref 150–440)
RBC: 4.27 MIL/uL (ref 3.80–5.20)
RDW: 13.2 % (ref 11.5–14.5)
WBC: 11.7 10*3/uL — ABNORMAL HIGH (ref 3.6–11.0)

## 2017-08-13 LAB — LIPASE, BLOOD: Lipase: 12 U/L (ref 11–51)

## 2017-08-13 LAB — POCT PREGNANCY, URINE: PREG TEST UR: NEGATIVE

## 2017-08-13 MED ORDER — SODIUM CHLORIDE 0.9 % IV BOLUS (SEPSIS)
1000.0000 mL | Freq: Once | INTRAVENOUS | Status: AC
  Administered 2017-08-13: 1000 mL via INTRAVENOUS

## 2017-08-13 MED ORDER — ONDANSETRON HCL 4 MG PO TABS: 4.0000 mg | ORAL_TABLET | Freq: Three times a day (TID) | ORAL | 0 refills | Status: DC | PRN

## 2017-08-13 MED ORDER — DICYCLOMINE HCL 20 MG PO TABS: 20.0000 mg | ORAL_TABLET | Freq: Three times a day (TID) | ORAL | 0 refills | Status: DC | PRN

## 2017-08-13 MED ORDER — ONDANSETRON HCL 4 MG/2ML IJ SOLN
4.0000 mg | Freq: Once | INTRAMUSCULAR | Status: AC | PRN
  Administered 2017-08-13: 4 mg via INTRAVENOUS
  Filled 2017-08-13: qty 2

## 2017-08-13 NOTE — ED Triage Notes (Signed)
Pt comes into the ED via POV c/o N/V/D that started an hour after eating at [employer] last night.  Patient states she is unable to keep food or drink down at this time.  Patient in NAD with even and unlabored respirations.  Denies any shortness of breath or chest pain.

## 2017-08-13 NOTE — ED Notes (Signed)
First Nurse Note:  Patient complaining of vomiting since yesterday PM.

## 2017-08-13 NOTE — ED Provider Notes (Signed)
[facility] Emergency Department Provider Note  ____________________________________________   I have reviewed the triage vital signs and the nursing notes.   HISTORY  Chief Complaint Nausea and Emesis   History limited by: Not Limited   HPI Courtney [name] is a 31 y.o. female who presents to the emergency department today because of concern for nausea, vomiting and emesis.  DURATION:1 day TIMING: constant SEVERITY: severe QUALITY: non bloody CONTEXT: patient states symptoms started 1 hour after she finished eating dinner. No known sick contacts. MODIFYING FACTORS: none ASSOCIATED SYMPTOMS: abdominal pain  Per medical record review patient has a history of syncope.  Past Medical History:  Diagnosis Date  . Menorrhagia     Patient Active Problem List   Diagnosis Date Noted  . Syncope 07/23/2017    History reviewed. No pertinent surgical history.  Prior to Admission medications   Medication Sig Start Date End Date Taking? Authorizing Provider  Boric Acid POWD Place 1 suppository vaginally.    [provider]  folic acid (FOLVITE) 800 MCG tablet Take 400 mcg by mouth daily.    [provider]  Probiotic Product (PROBIOTIC DAILY) CAPS Take 1 capsule by mouth.    [provider]  vitamin C (ASCORBIC ACID) 500 MG tablet Take 500 mg by mouth daily.    [provider]    Allergies Patient has no known allergies.  Family History  Problem Relation Age of Onset  . Hypertension Mother     Social History Social History   Tobacco Use  . Smoking status: Never Smoker  . Smokeless tobacco: Never Used  Substance Use Topics  . Alcohol use: No    Comment: occ  . Drug use: No    Review of Systems Constitutional: No fever/chills Eyes: No visual changes. ENT: No sore throat. Cardiovascular: Denies chest pain. Respiratory: Denies shortness of breath. Gastrointestinal: Positive for abdominal pain. Positive  for N/V/D Genitourinary: Negative for dysuria. Musculoskeletal: Negative for back pain. Skin: Negative for rash. Neurological: Negative for headaches, focal weakness or numbness.  ____________________________________________   PHYSICAL EXAM:  VITAL SIGNS: ED Triage Vitals  Enc Vitals Group     BP 08/13/17 1230 105/70     Pulse Rate 08/13/17 1230 (!) 120     Resp 08/13/17 1230 20     Temp 08/13/17 1230 97.9 F (36.6 C)     Temp Source 08/13/17 1230 Oral     SpO2 08/13/17 1230 100 %     Weight 08/13/17 1230 145 lb (65.8 kg)     Height 08/13/17 1230 5\' 6"  (1.676 m)     Head Circumference --      Peak Flow --      Pain Score 08/13/17 1229 10   Constitutional: Alert and oriented. Well appearing and in no distress. Eyes: Conjunctivae are normal.  ENT   Head: Normocephalic and atraumatic.   Nose: No congestion/rhinnorhea.   Mouth/Throat: Mucous membranes are moist.   Neck: No stridor. Hematological/Lymphatic/Immunilogical: No cervical lymphadenopathy. Cardiovascular: Normal rate, regular rhythm.  No murmurs, rubs, or gallops.  Respiratory: Normal respiratory effort without tachypnea nor retractions. Breath sounds are clear and equal bilaterally. No wheezes/rales/rhonchi. Gastrointestinal: Soft and somewhat diffusely tender. No rebound. No guarding.  Genitourinary: Deferred Musculoskeletal: Normal range of motion in all extremities. No lower extremity edema. Neurologic:  Normal speech and language. No gross focal neurologic deficits are appreciated.  Skin:  Skin is warm, dry and intact. No rash noted. Psychiatric: Mood and affect are normal.  Speech and behavior are normal. Patient exhibits appropriate insight and judgment.  ____________________________________________    LABS (pertinent positives/negatives)  Upreg neg Lipase 12 CMP tot bili 2.2, glu  124  ____________________________________________   EKG  None  ____________________________________________    RADIOLOGY  None  ____________________________________________   PROCEDURES  Procedures  ____________________________________________   INITIAL IMPRESSION / ASSESSMENT AND PLAN / ED COURSE  Pertinent labs & imaging results that were available during my care of the patient were reviewed by me and considered in my medical decision making (see chart for details).  Patient presents for n/v/d. ddx would include pregnancy, food poisoning, gastroenteritis, pancreatitis, diverticulitis amongst other etiologies. Patient work up with normal lipase. Mild leukocytosis, elevated bili without other elevation of liver enzymes. At this point I think gastroenteritis/food poisoning most likely. Patient was able to tolerate PO after zofran and some fluids. Will discharge with zofran and benytl. Discussed return precautions.   ____________________________________________   FINAL CLINICAL IMPRESSION(S) / ED DIAGNOSES  Final diagnoses:  Nausea vomiting and diarrhea  Gastroenteritis     Note: This dictation was prepared with Dragon dictation. Any transcriptional errors that result from this process are unintentional     Phineas SemenGoodman, Cotton Beckley, MD 08/13/17 1347

## 2017-08-13 NOTE — Discharge Instructions (Signed)
Please seek medical attention for any high fevers, chest pain, shortness of breath, change in behavior, persistent vomiting, bloody stool or any other new or concerning symptoms.  

## 2017-09-04 LAB — HM PAP SMEAR: HM Pap smear: NORMAL

## 2017-10-16 ENCOUNTER — Encounter: Payer: Self-pay | Admitting: *Deleted

## 2017-12-05 ENCOUNTER — Ambulatory Visit: Payer: Self-pay | Admitting: Certified Nurse Midwife

## 2018-06-15 ENCOUNTER — Emergency Department: Payer: Self-pay

## 2018-06-15 ENCOUNTER — Encounter: Payer: Self-pay | Admitting: Emergency Medicine

## 2018-06-15 ENCOUNTER — Emergency Department
Admission: EM | Admit: 2018-06-15 | Discharge: 2018-06-15 | Disposition: A | Discharge status: Home / Self Care | Payer: Self-pay | Attending: Emergency Medicine | Admitting: Emergency Medicine

## 2018-06-15 ENCOUNTER — Other Ambulatory Visit: Payer: Self-pay

## 2018-06-15 DIAGNOSIS — R0602 Shortness of breath: Secondary | ICD-10-CM | POA: Insufficient documentation

## 2018-06-15 DIAGNOSIS — Z79899 Other long term (current) drug therapy: Secondary | ICD-10-CM | POA: Insufficient documentation

## 2018-06-15 DIAGNOSIS — R55 Syncope and collapse: Secondary | ICD-10-CM | POA: Insufficient documentation

## 2018-06-15 HISTORY — DX: Unspecified convulsions: R56.9

## 2018-06-15 LAB — BASIC METABOLIC PANEL
ANION GAP: 7 (ref 5–15)
BUN: 9 mg/dL (ref 6–20)
CHLORIDE: 107 mmol/L (ref 98–111)
CO2: 23 mmol/L (ref 22–32)
CREATININE: 0.8 mg/dL (ref 0.44–1.00)
Calcium: 9 mg/dL (ref 8.9–10.3)
GFR calc non Af Amer: >60 mL/min (ref >60)
Glucose, Bld: 89 mg/dL (ref 70–99)
POTASSIUM: 3.8 mmol/L (ref 3.5–5.1)
Sodium: 137 mmol/L (ref 135–145)

## 2018-06-15 LAB — CBC
HEMATOCRIT: 39.8 % (ref 35.0–47.0)
HEMOGLOBIN: 13.7 g/dL (ref 12.0–16.0)
MCH: 33.1 pg (ref 26.0–34.0)
MCHC: 34.3 g/dL (ref 32.0–36.0)
MCV: 96.6 fL (ref 80.0–100.0)
Platelets: 231 10*3/uL (ref 150–440)
RBC: 4.12 MIL/uL (ref 3.80–5.20)
RDW: 13.1 % (ref 11.5–14.5)
WBC: 6.2 10*3/uL (ref 3.6–11.0)

## 2018-06-15 LAB — URINALYSIS, COMPLETE (UACMP) WITH MICROSCOPIC
BACTERIA UA: NONE SEEN
Bilirubin Urine: NEGATIVE
GLUCOSE, UA: NEGATIVE mg/dL
Hgb urine dipstick: NEGATIVE
KETONES UR: 20 mg/dL — AB
Leukocytes, UA: NEGATIVE
Nitrite: NEGATIVE
PROTEIN: NEGATIVE mg/dL
Specific Gravity, Urine: 1.016 (ref 1.005–1.030)
pH: 5 (ref 5.0–8.0)

## 2018-06-15 LAB — POC URINE PREG, ED: Preg Test, Ur: NEGATIVE

## 2018-06-15 MED ORDER — ACETAMINOPHEN 325 MG PO TABS
650.0000 mg | ORAL_TABLET | Freq: Once | ORAL | Status: AC
  Administered 2018-06-15: 650 mg via ORAL
  Filled 2018-06-15: qty 2

## 2018-06-15 MED ORDER — SODIUM CHLORIDE 0.9 % IV BOLUS
1000.0000 mL | Freq: Once | INTRAVENOUS | Status: AC
  Administered 2018-06-15: 1000 mL via INTRAVENOUS

## 2018-06-15 NOTE — ED Provider Notes (Signed)
[facility] Emergency Department Provider Note  ____________________________________________  Time seen: Approximately 9:30 PM  I have reviewed the triage vital signs and the nursing notes.   HISTORY  Chief Complaint Loss of Consciousness    HPI Courtney [name] is a 32 y.o. female with a history of syncope presenting for syncope.  The patient reports that she was walking into work when she began to feel short of breath and then had a brief syncopal episode without any injury.  She did not bite her tongue, or have any urinary or fecal incontinence.  Per report, she may have had some shaking but none of the witnesses are here.  The patient did not describe a postictal state.  At this time, the patient is feeling back to normal baseline, with a mild headache.  She has not had any recent illness, nausea vomiting or diarrhea, chest pain, palpitations, diaphoresis.  The patient was discharged 07/24/2017 after evaluation for recurrent syncope.  She had a reassuring cardiac work-up in one home with a Holter monitor, which she states did not yield any abnormalities.  She was also seen by neurology, who performed a EEG that did not show any evidence of epilepsy.  She did not follow-up as an outpatient.  FH: No sudden cardiac death; no unexplained deaths in the family; no history of DVT or PE    Past Medical History:  Diagnosis Date  . Menorrhagia   . Seizures [facility])     Patient Active Problem List   Diagnosis Date Noted  . Syncope 07/23/2017    History reviewed. No pertinent surgical history.  Current Outpatient Rx  . Order #: [id number] Class: Historical Med  . Order #: [id number] Class: Print  . Order #: [id number] Class: Historical Med  . Order #: [id number] Class: Print  . Order #: [id number] Class: Historical Med  . Order #: [id number] Class: Historical Med    Allergies Patient has no known allergies.  Family History  Problem Relation Age of Onset  . Hypertension  Mother     Social History Social History   Tobacco Use  . Smoking status: Never Smoker  . Smokeless tobacco: Never Used  Substance Use Topics  . Alcohol use: No    Comment: occ  . Drug use: No    Review of Systems Constitutional: No fever/chills.  No lightheadedness or diaphoresis.  Positive syncope. Eyes: No visual changes. ENT: No sore throat. No congestion or rhinorrhea. Cardiovascular: Denies chest pain. Denies palpitations. Respiratory: Positive shortness of breath.  No cough. Gastrointestinal: No abdominal pain.  No nausea, no vomiting.  No diarrhea.  No constipation. Genitourinary: Negative for dysuria.  LMP 05/24/2018 Musculoskeletal: Negative for back pain.  Denies neck pain.  No lower extremity swelling or calf pain. Skin: Negative for rash. Neurological: Positive for mild headache. No focal numbness, tingling or weakness.  Normal ambulation without ataxia. Psych: Denies any recent significant stressors.   ____________________________________________   PHYSICAL EXAM:  VITAL SIGNS: ED Triage Vitals  Enc Vitals Group     BP 06/15/18 1714 105/66     Pulse Rate 06/15/18 1714 82     Resp 06/15/18 1714 16     Temp 06/15/18 1714 98.9 F (37.2 C)     Temp Source 06/15/18 1714 Oral     SpO2 06/15/18 1714 100 %     Weight 06/15/18 1710 143 lb (64.9 kg)     Height 06/15/18 1710 5\' 6"  (1.676 m)     Head Circumference --  Peak Flow --      Pain Score 06/15/18 1710 5     Pain Loc --      Pain Edu? --      Excl. in GC? --     Constitutional: Alert and oriented. Answers questions appropriately.  GCS is 15. Eyes: Conjunctivae are normal.  EOMI. PERRLA.  No vertical or horizontal nystagmus.  No raccoon eyes.  No scleral icterus. Head: Atraumatic.  No battle sign. Nose: No congestion/rhinnorhea.  No swelling over the nose or septal hematoma. Mouth/Throat: Mucous membranes are moist.  No dental injury or malocclusion. Neck: No stridor.  Supple.  No JVD.  No  meningismus. Cardiovascular: Normal rate, regular rhythm. No murmurs, rubs or gallops.  Respiratory: Normal respiratory effort.  No accessory muscle use or retractions. Lungs CTAB.  No wheezes, rales or ronchi. Gastrointestinal: Soft, nontender and nondistended.  No guarding or rebound.  No peritoneal signs. Musculoskeletal: No LE edema. No ttp in the calves or palpable cords.  Negative Homan's sign. Neurologic:  A&Ox3.  Speech is clear.  Face and smile are symmetric.  EOMI.  Moves all extremities well.  Normal gait without ataxia. Skin:  Skin is warm, dry and intact. No rash noted. Psychiatric: Depressed mood with normal affect. ____________________________________________   LABS (all labs ordered are listed, but only abnormal results are displayed)  Labs Reviewed  URINALYSIS, COMPLETE (UACMP) WITH MICROSCOPIC - Abnormal; Notable for the following components:      Result Value   Color, Urine YELLOW (*)    APPearance HAZY (*)    Ketones, ur 20 (*)    All other components within normal limits  BASIC METABOLIC PANEL  CBC  POC URINE PREG, ED  CBG MONITORING, ED   ____________________________________________  EKG  ED ECG REPORT I, Anne-Caroline Sharma Covert, the attending physician, personally viewed and interpreted this ECG.   Date: 06/15/2018  EKG Time: 1711  Rate: 71  Rhythm: normal sinus rhythm  Axis: normal  Intervals:none  ST&T Change: Nonspecific T wave inversion in V1.  No STEMI.  ____________________________________________  RADIOLOGY  Dg Chest 2 View  Result Date: 06/15/2018 CLINICAL DATA:  32 year old female with syncope. EXAM: CHEST - 2 VIEW COMPARISON:  Chest radiograph dated 07/23/2017 FINDINGS: The heart size and mediastinal contours are within normal limits. Both lungs are clear. The visualized skeletal structures are unremarkable. IMPRESSION: No active cardiopulmonary disease. Electronically Signed   By: Elgie Collard M.D.   On: 06/15/2018 21:52     ____________________________________________   PROCEDURES  Procedure(s) performed: None  Procedures  Critical Care performed: No ____________________________________________   INITIAL IMPRESSION / ASSESSMENT AND PLAN / ED COURSE  Pertinent labs & imaging results that were available during my care of the patient were reviewed by me and considered in my medical decision making (see chart for details).  32 y.o. female, with a history of recurrent syncope, presenting with syncope.  Overall, the patient is hemodynamically stable.  Patient's heart rate does increase from 73-90 from sitting to standing, but her blood pressure remained stable.  While there was some report of shaking, no witnesses are here.  It is possible that she may have had a seizure and that her prior episodes were seizures as well, but she has regained full mental status so no further evaluation is warranted for this.  She need to follow-up with the outpatient neurologist to continue her evaluation.  Other possible etiologies include vasovagal syncope, hypovolemia although this would be abnormal as the patient is not orthostatic.  Intermittent arrhythmia cannot be excluded but I do not see any evidence of Brugada syndrome, prolonged QTC or hypertrophy, nor any ischemic changes on her EKG.  Also for pregnancy test, as well as UA and chest x-ray.  Plan reevaluation for final disposition.  ----------------------------------------- 10:14 PM on 06/15/2018 -----------------------------------------  Patient's chest x-ray does not show any acute cardia pulmonary process and her UA does not show infection.  Her pregnancy test is negative.  At this time, the patient is asymptomatic.  Plan discharge with close PMD as well as neurology follow-up.  Return precautions as well as follow-up instructions were discussed with the patient and her mother.  ____________________________________________  FINAL CLINICAL IMPRESSION(S) / ED  DIAGNOSES  Final diagnoses:  Syncope, unspecified syncope type  Shortness of breath         NEW MEDICATIONS STARTED DURING THIS VISIT:  New Prescriptions   No medications on file      Rockne MenghiniNorman, Anne-Caroline, MD 06/15/18 2215

## 2018-06-15 NOTE — ED Triage Notes (Signed)
Pt here from work via EMS with c/o syncopal episode, states she was dizzy prior to fall, BG 106 per EMS, pt states she has a history of seizures, last one a year ago, however, onlookers denied any seizure like activity. Pt states she has a headache now and mild cp, didn't fully hit the ground with syncope, was caught and lowered down. NAD.

## 2018-06-15 NOTE — Discharge Instructions (Addendum)
Please drink plenty of fluids to stay well-hydrated.  Make a follow-up appointment with a primary care physician, as well as a neurologist to continue evaluation for possible seizures.  Do not drive until you are cleared by the neurologist to drive.  Return to the emergency department if you develop severe pain, lightheadedness or fainting, seizures, fever, changes in mental status, or any other symptoms concerning to you.

## 2018-06-15 NOTE — ED Notes (Signed)
Patient transported to X-ray 

## 2018-09-30 LAB — HM HIV SCREENING LAB: HM HIV Screening: NEGATIVE

## 2019-01-01 ENCOUNTER — Other Ambulatory Visit: Payer: Self-pay

## 2019-01-01 ENCOUNTER — Encounter: Payer: Self-pay | Admitting: Emergency Medicine

## 2019-01-01 ENCOUNTER — Ambulatory Visit
Admission: EM | Admit: 2019-01-01 | Discharge: 2019-01-01 | Disposition: A | Discharge status: Home / Self Care | Payer: Self-pay | Attending: Family Medicine | Admitting: Family Medicine

## 2019-01-01 DIAGNOSIS — R300 Dysuria: Secondary | ICD-10-CM

## 2019-01-01 DIAGNOSIS — R8290 Unspecified abnormal findings in urine: Secondary | ICD-10-CM

## 2019-01-01 LAB — WET PREP, GENITAL
Clue Cells Wet Prep HPF POC: NONE SEEN
Sperm: NONE SEEN
Trich, Wet Prep: NONE SEEN
Yeast Wet Prep HPF POC: NONE SEEN

## 2019-01-01 LAB — URINALYSIS, COMPLETE (UACMP) WITH MICROSCOPIC
Bilirubin Urine: NEGATIVE
Glucose, UA: NEGATIVE mg/dL
Hgb urine dipstick: NEGATIVE
Ketones, ur: NEGATIVE mg/dL
Leukocytes,Ua: NEGATIVE
Nitrite: NEGATIVE
Protein, ur: NEGATIVE mg/dL
Specific Gravity, Urine: 1.02 (ref 1.005–1.030)
pH: 7 (ref 5.0–8.0)

## 2019-01-01 MED ORDER — SULFAMETHOXAZOLE-TRIMETHOPRIM 800-160 MG PO TABS: 1.0000 | ORAL_TABLET | Freq: Two times a day (BID) | ORAL | 0 refills | Status: AC

## 2019-01-01 NOTE — ED Triage Notes (Signed)
Pt c/o urinary odor and a yellow vaginal discharge. She states that she just came off her menstrual cycle. No concern for STD.

## 2019-01-01 NOTE — Discharge Instructions (Addendum)
Take medication as prescribed. Rest. Drink plenty of fluids. Monitor as discussed.   Follow up with your primary care physician this week as needed. Return to Urgent care for new or worsening concerns.   

## 2019-01-01 NOTE — ED Provider Notes (Signed)
[facility] ____________________________________________  Time seen: Approximately 7:42 PM  I have reviewed the triage vital signs and the nursing notes.   HISTORY  Chief Complaint urinary odor and Vaginal Discharge   HPI Courtney [name] is a 33 y.o. female presenting for evaluation of malodorous urine since yesterday.  States she is also just completing her menstrual cycle within the last day and has had some reddish-yellowish discharge vaginally, states she does sometimes have this at the end of her menstrual cycle.  States the vaginal discharge is nonodorous.  States she has had bacterial vaginosis previously and the discharge does not have the similar odor.  States not concerned of STDs and declines STD testing.  States has a slight discomfort at the beginning of urination but denies any other urinary discomfort.  No urinary frequency or urgency.  Denies accompanying abdominal pain, back pain, fevers, cough, congestion or recent sickness.  Denies vaginal rash or lesions.  Denies alleviating measures attempted.  Denies aggravating factors.  Reports otherwise doing well.  Patient's last menstrual period was 12/26/2018 (approximate).  Denies pregnancy    Past Medical History:  Diagnosis Date  . Menorrhagia   . Seizures [facility](HCC)     Patient Active Problem List   Diagnosis Date Noted  . Syncope 07/23/2017    Past Surgical History:  Procedure Laterality Date  . NO PAST SURGERIES       No current facility-administered medications for this encounter.   Current Outpatient Medications:  .  folic acid (FOLVITE) 800 MCG tablet, Take 400 mcg by mouth daily., Disp: , Rfl:  .  vitamin C (ASCORBIC ACID) 500 MG tablet, Take 500 mg by mouth daily., Disp: , Rfl:  .  sulfamethoxazole-trimethoprim (BACTRIM DS,SEPTRA DS) 800-160 MG tablet, Take 1 tablet by mouth 2 (two) times daily for 3 days., Disp: 6 tablet, Rfl: 0  Allergies Patient has no known allergies.  Family History   Problem Relation Age of Onset  . Hypertension Mother     Social History Social History   Tobacco Use  . Smoking status: Never Smoker  . Smokeless tobacco: Never Used  Substance Use Topics  . Alcohol use: Yes    Comment: occ  . Drug use: No    Review of Systems Constitutional: No fever ENT: No sore throat. Cardiovascular: Denies chest pain. Respiratory: Denies shortness of breath. Gastrointestinal: No abdominal pain.  No nausea, no vomiting.  No diarrhea.   Genitourinary: Positive for dysuria. Musculoskeletal: Negative for back pain. Skin: Negative for rash.   ____________________________________________   PHYSICAL EXAM:  VITAL SIGNS: ED Triage Vitals  Enc Vitals Group     BP 01/01/19 1840 115/82     Pulse Rate 01/01/19 1840 87     Resp 01/01/19 1840 18     Temp 01/01/19 1840 98.1 F (36.7 C)     Temp Source 01/01/19 1840 Oral     SpO2 01/01/19 1840 100 %     Weight 01/01/19 1836 142 lb (64.4 kg)     Height 01/01/19 1836 5\' 6"  (1.676 m)     Head Circumference --      Peak Flow --      Pain Score 01/01/19 1836 0     Pain Loc --      Pain Edu? --      Excl. in GC? --     Constitutional: Alert and oriented. Well appearing and in no acute distress. ENT      Head: Normocephalic and atraumatic. Cardiovascular: Good  peripheral circulation. Respiratory: Normal respiratory effort without tachypnea nor retractions.  Gastrointestinal: Soft and nontender. No CVA tenderness. Musculoskeletal: Steady gait.  Neurologic:  Normal speech and language. Speech is normal. No gait instability.  Skin:  Skin is warm, dry Psychiatric: Mood and affect are normal. Speech and behavior are normal. Patient exhibits appropriate insight and judgment   ___________________________________________   LABS (all labs ordered are listed, but only abnormal results are displayed)  Labs Reviewed  WET PREP, GENITAL - Abnormal; Notable for the following components:      Result Value   WBC,  Wet Prep HPF POC FEW (*)    All other components within normal limits  URINALYSIS, COMPLETE (UACMP) WITH MICROSCOPIC - Abnormal; Notable for the following components:   Bacteria, UA FEW (*)    All other components within normal limits   ____________________________________________   PROCEDURES Procedures    INITIAL IMPRESSION / ASSESSMENT AND PLAN / ED COURSE  Pertinent labs & imaging results that were available during my care of the patient were reviewed by me and considered in my medical decision making (see chart for details).  Well appearing patient. Elected self wet prep. Declines Std concerns and testing. Concern for uti, patient declined urine culture. Will treat with bactrim. Encouraged monitoring, supportive care. Discussed indication, risks and benefits of medications with patient.  Discussed follow up with Primary care physician this week as needed. Discussed follow up and return parameters including no resolution or any worsening concerns. Patient verbalized understanding and agreed to plan.   ____________________________________________   FINAL CLINICAL IMPRESSION(S) / ED DIAGNOSES  Final diagnoses:  Bad odor of urine  Dysuria     ED Discharge Orders         Ordered    sulfamethoxazole-trimethoprim (BACTRIM DS,SEPTRA DS) 800-160 MG tablet  2 times daily     01/01/19 1920           Note: This dictation was prepared with Dragon dictation along with smaller phrase technology. Any transcriptional errors that result from this process are unintentional.         Renford Dills, NP 01/01/19 1953

## 2019-05-27 ENCOUNTER — Other Ambulatory Visit: Payer: Self-pay

## 2019-05-27 ENCOUNTER — Encounter: Payer: Self-pay | Admitting: Advanced Practice Midwife

## 2019-05-27 ENCOUNTER — Ambulatory Visit: Payer: Self-pay | Admitting: Advanced Practice Midwife

## 2019-05-27 DIAGNOSIS — Z3009 Encounter for other general counseling and advice on contraception: Secondary | ICD-10-CM

## 2019-05-27 DIAGNOSIS — Z113 Encounter for screening for infections with a predominantly sexual mode of transmission: Secondary | ICD-10-CM

## 2019-05-27 LAB — WET PREP FOR TRICH, YEAST, CLUE
Trichomonas Exam: NEGATIVE
Yeast Exam: NEGATIVE

## 2019-05-27 MED ORDER — MULTIVITAMINS PO CAPS: 1.0000 | ORAL_CAPSULE | Freq: Every day | ORAL | 0 refills | Status: DC

## 2019-05-27 NOTE — Progress Notes (Signed)
    STI clinic/screening visit  Subjective:  Courtney [name] is a 33 y.o. female being seen today for an STI screening visit. The patient reports they do have symptoms.  Patient has the following medical conditions:   Patient Active Problem List   Diagnosis Date Noted  . Syncope 07/23/2017  . 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 CIN II (cervical intraepithelial neoplasia II) 09/08/2013     Chief Complaint  Patient presents with  . SEXUALLY TRANSMITTED DISEASE    HPI  Patient reports white discharge with sl malodor today x1  See flowsheet for further details and programmatic requirements.    The following portions of the patient's history were reviewed and updated as appropriate: allergies, current medications, past medical history, past social history, past surgical history and problem list.  Objective:  There were no vitals filed for this visit.  Physical Exam Constitutional:      Appearance: Normal appearance. She is normal weight.  HENT:     Head: Atraumatic.     Nose: Nose normal.     Mouth/Throat:     Mouth: Mucous membranes are moist.     Pharynx: No oropharyngeal exudate.  Eyes:     Conjunctiva/sclera: Conjunctivae normal.  Neck:     Musculoskeletal: Neck supple.  Pulmonary:     Effort: Pulmonary effort is normal.  Abdominal:     General: There is no distension.     Palpations: Abdomen is soft. There is no mass.     Tenderness: There is no abdominal tenderness. There is no guarding.  Genitourinary:    General: Normal vulva.     Exam position: Lithotomy position.     Labia:        Right: No lesion.        Left: No lesion.      Vagina: Vaginal discharge (small amt white leukorrhea, ph<4.5) present.     Cervix: Normal.     Uterus: Normal.      Rectum: Normal.  Lymphadenopathy:     Lower Body: No right inguinal adenopathy. No left inguinal adenopathy.  Skin:    General: Skin is warm and dry.  Neurological:     Mental Status: She is alert.   Psychiatric:        Mood and Affect: Mood normal.        Behavior: Behavior normal.       Assessment and Plan:  Courtney [name] is a 33 y.o. female presenting to the [facility] Department for STI screening  1. Family planning services Pt states she desires to conceive and has been actively trying since 09/2017 with last birth control (DMPA) 2017.  States her partner has 2 children and she has 25-28 day cycles and ovulates and uses an app to keep track - Multiple Vitamin (MULTIVITAMIN) capsule; Take 1 capsule by mouth daily.  Dispense: 100 capsule; Refill: 0  2. Screening examination for venereal disease Treat wet mount per standing orders Immunization nurse consult Please give [place] and [facility] numbers to pt - WET PREP FOR [place], YEAST, CLUE - Syphilis Serology, [place] [facility] - HIV [place] [facility] - Chlamydia/Gonorrhea [place] [facility] - Gonococcus culture     Return for PRN.  No future appointments.  Herbie Saxon, CNM

## 2019-05-27 NOTE — Progress Notes (Signed)
Wet mount reviewed, no tx per standing order. Provider orders completed. 

## 2019-05-27 NOTE — Progress Notes (Signed)
MVI given per standing order.

## 2019-05-31 LAB — GONOCOCCUS CULTURE

## 2019-12-31 ENCOUNTER — Ambulatory Visit: Payer: Self-pay

## 2020-05-17 ENCOUNTER — Other Ambulatory Visit: Payer: Self-pay

## 2020-05-17 ENCOUNTER — Ambulatory Visit: Payer: Self-pay | Admitting: Family Medicine

## 2020-05-17 ENCOUNTER — Encounter: Payer: Self-pay | Admitting: Family Medicine

## 2020-05-17 DIAGNOSIS — Z113 Encounter for screening for infections with a predominantly sexual mode of transmission: Secondary | ICD-10-CM

## 2020-05-17 LAB — WET PREP FOR TRICH, YEAST, CLUE
Trichomonas Exam: NEGATIVE
Yeast Exam: NEGATIVE

## 2020-05-17 NOTE — Progress Notes (Signed)
  [facility] Department STI clinic/screening visit  Subjective:  Courtney [name] is a 34 y.o. female being seen today for an STI screening visit. The patient reports they do have symptoms.  Patient reports that they do desire a pregnancy in the next year.   They reported they are not interested in discussing contraception today.  No LMP recorded.   Patient has the following medical conditions:   Patient Active Problem List   Diagnosis Date Noted  . Syncope 07/23/2017  . 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 CIN II (cervical intraepithelial neoplasia II) 09/08/2013    Chief Complaint  Patient presents with  . SEXUALLY TRANSMITTED DISEASE    screening     HPI  Patient reports symptoms of increased discharge and odor x 1 week.  Patient reports d/c as white.  Denies  Last HIV test per patient/review of record was 05/27/2019 Patient reports last pap was 09/04/2017   See flowsheet for further details and programmatic requirements.    The following portions of the patient's history were reviewed and updated as appropriate: allergies, current medications, past medical history, past social history, past surgical history and problem list.  Objective:  There were no vitals filed for this visit.  Physical Exam Constitutional:      Appearance: Normal appearance.  HENT:     Head: Normocephalic.     Mouth/Throat:     Pharynx: Oropharynx is clear. No oropharyngeal exudate or posterior oropharyngeal erythema.  Abdominal:     General: Abdomen is flat.     Palpations: Abdomen is soft. There is no mass.     Tenderness: There is no abdominal tenderness. There is no guarding.  Genitourinary:    Comments: External genitalia without, lice, nits, erythema, edema , lesions or inguinal adenopathy. Vagina with normal mucosa and discharge and pH equals 4.  Cervix without visual lesions, uterus firm, mobile, non-tender, no masses, CMT adnexal fullness or tenderness.  Musculoskeletal:      Cervical back: Normal range of motion and neck supple. No tenderness.  Lymphadenopathy:     Cervical: No cervical adenopathy.  Skin:    General: Skin is warm and dry.     Findings: No bruising, erythema, lesion or rash.  Neurological:     Mental Status: She is alert.  Psychiatric:        Mood and Affect: Mood normal.      Assessment and Plan:  Courtney [name] is a 34 y.o. female presenting to the [facility] Department for STI screening  1. Screening examination for venereal disease - HIV [place] [facility] - Chlamydia/Gonorrhea [place] [facility] - Syphilis Serology,  Lab - WET PREP FOR [place], YEAST, CLUE  Patient in clinic today for STI screening with reports of  symptoms.   Per wetprep no treatment needed.   Awaiting results of CT/ GC, HIV and RPR.  Recommend condoms with all sex.    Patient verbalizes and to call if any questions, concerns or if symptoms worsen.       No follow-ups on file.  No future appointments.  Wendi Snipes, RN

## 2020-05-26 NOTE — Progress Notes (Signed)
Attestation of Attending Supervision of Enhanced Role Nurse:  At public health departments, Enhanced Role Nurses work under standing orders and procedures to provide STI related screening services.. I agree with the care provided to this patient and was available for any consultation as needed.  I have reviewed the RN's note and chart.  I was not consulted by the RN for the patient. If consulted documentation of consult is reflected in the RN's documentation.  ° °Camaria Gerald Niles Darl Kuss, MD, MPH, ABFM °Medical Director  °[facility].  ° °

## 2020-08-16 LAB — OB RESULTS CONSOLE HIV ANTIBODY (ROUTINE TESTING): HIV: NONREACTIVE

## 2020-08-16 LAB — OB RESULTS CONSOLE GC/CHLAMYDIA
Chlamydia: NEGATIVE
Gonorrhea: NEGATIVE

## 2020-08-16 LAB — OB RESULTS CONSOLE HEPATITIS B SURFACE ANTIGEN: Hepatitis B Surface Ag: NEGATIVE

## 2020-08-16 LAB — OB RESULTS CONSOLE RPR: RPR: NONREACTIVE

## 2020-08-16 LAB — OB RESULTS CONSOLE RUBELLA ANTIBODY, IGM: Rubella: IMMUNE

## 2020-08-27 ENCOUNTER — Emergency Department: Payer: Medicaid Other

## 2020-08-27 ENCOUNTER — Other Ambulatory Visit: Payer: Self-pay

## 2020-08-27 ENCOUNTER — Emergency Department
Admission: EM | Admit: 2020-08-27 | Discharge: 2020-08-27 | Disposition: A | Discharge status: Home / Self Care | Payer: Medicaid Other | Attending: Emergency Medicine | Admitting: Emergency Medicine

## 2020-08-27 DIAGNOSIS — O23591 Infection of other part of genital tract in pregnancy, first trimester: Secondary | ICD-10-CM | POA: Diagnosis present

## 2020-08-27 DIAGNOSIS — B373 Candidiasis of vulva and vagina: Secondary | ICD-10-CM | POA: Diagnosis not present

## 2020-08-27 DIAGNOSIS — Z3A11 11 weeks gestation of pregnancy: Secondary | ICD-10-CM | POA: Insufficient documentation

## 2020-08-27 DIAGNOSIS — O4691 Antepartum hemorrhage, unspecified, first trimester: Secondary | ICD-10-CM | POA: Diagnosis not present

## 2020-08-27 DIAGNOSIS — O418X10 Other specified disorders of amniotic fluid and membranes, first trimester, not applicable or unspecified: Secondary | ICD-10-CM

## 2020-08-27 DIAGNOSIS — B3731 Acute candidiasis of vulva and vagina: Secondary | ICD-10-CM

## 2020-08-27 DIAGNOSIS — O468X1 Other antepartum hemorrhage, first trimester: Secondary | ICD-10-CM

## 2020-08-27 LAB — URINALYSIS, COMPLETE (UACMP) WITH MICROSCOPIC
Bilirubin Urine: NEGATIVE
Glucose, UA: NEGATIVE mg/dL
Hgb urine dipstick: NEGATIVE
Ketones, ur: 20 mg/dL — AB
Nitrite: NEGATIVE
Protein, ur: NEGATIVE mg/dL
Specific Gravity, Urine: 1.026 (ref 1.005–1.030)
pH: 5 (ref 5.0–8.0)

## 2020-08-27 LAB — WET PREP, GENITAL
Clue Cells Wet Prep HPF POC: NONE SEEN
Sperm: NONE SEEN
Trich, Wet Prep: NONE SEEN

## 2020-08-27 MED ORDER — MICONAZOLE 7 100 MG VA SUPP: 100.0000 mg | Freq: Every day | VAGINAL | 0 refills | Status: DC

## 2020-08-27 NOTE — ED Triage Notes (Signed)
Pt states she has been on an antibiotic for a UTI and is now having vaginal itching and a change in her discharge- pt is [redacted] weeks pregnant and states the antibiotics are making it harder to keep food and fluids down

## 2020-08-27 NOTE — ED Provider Notes (Signed)
[facility] Emergency Department Provider Note  ____________________________________________   First MD Initiated Contact with Patient 08/27/20 1840     (approximate)  I have reviewed the triage vital signs and the nursing notes.   HISTORY  Chief Complaint Vaginal Itching  HPI Courtney [name] is a 34 y.o. female reports to the emergency department for evaluation of vaginal itching, vaginal discharge as well as intermittent lower abdominal pain.  She describes the discharge as thick and chunky, sort of like "cottage cheese".  The patient states that she is [redacted] weeks pregnant.  She was seen 5 days ago at [facility] emergency room for evaluation of a syncopal episode at work while pregnant.  She states that she was started on Keflex for suspected UTI at that time.  She states that after beginning the Keflex, approximately 2 days later she began having vaginal discharge with itching.  She also reports that around that time she began having some intermittent lower abdominal pain that she describes as being near the groin.  This is intermittent in nature and she cannot determine what worsens or improves it.  She denies any vaginal bleeding, sudden gush of fluid.  She denies any problems prior to this with the pregnancy.  She has had some nausea and vomiting associated with the pregnancy, however she states that this is unchanged from what it was prior to recent evaluation.  She states she does have a history of getting yeast infections with antibiotics.         Past Medical History:  Diagnosis Date  . Menorrhagia   . Seizures [id number])     Patient Active Problem List   Diagnosis Date Noted  . Syncope 07/23/2017  . 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 CIN II (cervical intraepithelial neoplasia II) 09/08/2013    Past Surgical History:  Procedure Laterality Date  . NO PAST SURGERIES      Prior to Admission medications   Medication Sig Start Date End  Date Taking? Authorizing Provider  folic acid (FOLVITE) 800 MCG tablet Take 400 mcg by mouth daily.    [provider]  miconazole (MICONAZOLE 7) 100 MG vaginal suppository Place 1 suppository (100 mg total) vaginally at bedtime. 08/27/20   Lucy Chris, PA  Multiple Vitamin (MULTIVITAMIN) capsule Take 1 capsule by mouth daily. 05/27/19   Federico Flake, MD  vitamin C (ASCORBIC ACID) 500 MG tablet Take 500 mg by mouth daily.    [provider]    Allergies Patient has no known allergies.  Family History  Problem Relation Age of Onset  . Hypertension Mother     Social History Social History   Tobacco Use  . Smoking status: Never Smoker  . Smokeless tobacco: Never Used  Vaping Use  . Vaping Use: Never used  Substance Use Topics  . Alcohol use: Yes    Alcohol/week: 2.0 standard drinks    Types: 2 Glasses of wine per week    Comment: occ  . Drug use: No    Review of Systems Constitutional: No fever/chills Eyes: No visual changes. ENT: No sore throat. Cardiovascular: Denies chest pain. Respiratory: Denies shortness of breath. Gastrointestinal: + abdominal pain.  + nausea, + vomiting.  No diarrhea.  No constipation. Genitourinary: + Pregnancy, + vaginal itching, + vaginal discharge, negative for dysuria. Musculoskeletal: Negative for back pain. Skin: Negative for rash. Neurological: Negative for headaches, focal weakness or numbness.   ____________________________________________   PHYSICAL EXAM:  VITAL SIGNS:  ED Triage Vitals  Enc Vitals Group     BP 08/27/20 1558 114/60     Pulse Rate 08/27/20 1558 83     Resp 08/27/20 1558 16     Temp 08/27/20 1558 98.8 F (37.1 C)     Temp Source 08/27/20 1558 Oral     SpO2 08/27/20 1558 100 %     Weight 08/27/20 1559 138 lb (62.6 kg)     Height 08/27/20 1559 5\' 6"  (1.676 m)     Head Circumference --      Peak Flow --      Pain Score 08/27/20 1559 0     Pain Loc --      Pain Edu? --       Excl. in GC? --     Constitutional: Alert and oriented. Well appearing and in no acute distress. Eyes: Conjunctivae are normal. PERRL. EOMI. Head: Atraumatic. Nose: No congestion/rhinnorhea. Mouth/Throat: Mucous membranes are moist.  Oropharynx non-erythematous. Neck: No stridor.   Cardiovascular: Normal rate, regular rhythm. Grossly normal heart sounds.  Good peripheral circulation. Respiratory: Normal respiratory effort.  No retractions. Lungs CTAB. Gastrointestinal: Soft and nontender. No distention. No abdominal bruits. No CVA tenderness. Genitourinary: Deferred. Musculoskeletal: No lower extremity tenderness nor edema.  No joint effusions. Neurologic:  Normal speech and language. No gross focal neurologic deficits are appreciated. No gait instability. Skin:  Skin is warm, dry and intact. No rash noted. Psychiatric: Mood and affect are normal. Speech and behavior are normal.  ____________________________________________   LABS (all labs ordered are listed, but only abnormal results are displayed)  Labs Reviewed  WET PREP, GENITAL - Abnormal; Notable for the following components:      Result Value   Yeast Wet Prep HPF POC PRESENT (*)    WBC, Wet Prep HPF POC MANY (*)    All other components within normal limits  URINALYSIS, COMPLETE (UACMP) WITH MICROSCOPIC - Abnormal; Notable for the following components:   Color, Urine YELLOW (*)    APPearance HAZY (*)    Ketones, ur 20 (*)    Leukocytes,Ua MODERATE (*)    Bacteria, UA RARE (*)    All other components within normal limits  URINE CULTURE   ____________________________________________  RADIOLOGY  Official radiology report(s): 14/05/21 OB Comp Less 14 Wks  Result Date: 08/27/2020 CLINICAL DATA:  Lower abdominal pain EXAM: OBSTETRIC <14 WK ULTRASOUND TECHNIQUE: Transabdominal ultrasound was performed for evaluation of the gestation as well as the maternal uterus and adnexal regions. COMPARISON:  None. FINDINGS: Intrauterine  gestational sac: Single Yolk sac:  Not Visualized. Embryo:  Visualized. Cardiac Activity: Visualized. Heart Rate: 162 bpm CRL: 52.6 mm   12 w 0 d                  14/01/2020 EDC: 03/11/2021 Subchorionic hemorrhage: Small subchorionic hemorrhage along the superior left aspect measuring 2.5 x 1.2 cm. Maternal uterus/adnexae: Right ovary measures 2.9 x 2.1 x 1.7 cm in the left ovary measures 2.5 x 1.5 x 1.9 cm. No free fluid or adnexal mass. IMPRESSION: 1. Single live intrauterine pregnancy as above estimated age 31 weeks and 0 days. 2. Small subchorionic hemorrhage. Electronically Signed   By: 13 weeks M.D.   On: 08/27/2020 20:30     ____________________________________________   INITIAL IMPRESSION / ASSESSMENT AND PLAN / ED COURSE  As part of my medical decision making, I reviewed the following data within the electronic MEDICAL RECORD NUMBER Nursing notes reviewed and incorporated, Labs reviewed, Old  chart reviewed and Notes from prior ED visits        Patient is a 34 year old female who presents to the emergency department [redacted] weeks pregnant for evaluation of vaginal discharge after recent antibiotic prescription for UTI as well as some intermittent lower abdominal pain.  She states she was seen 5 days ago at [facility] for evaluation of syncope and was diagnosed with UTI and put on Keflex at that time.  Review of records from [facility] reveal a urine culture that grew out less than 10,000 colonies.  Wet prep today does reveal yeast and many white cells.  Repeat urinalysis does reveal moderate leukocytes with rare bacteria and repeat culture will be obtained.  I suspect her dysuria is coming from the yeast.  Pelvic ultrasound was obtained to evaluate pregnancy given her abdominal pain.  IUP was seen as well as small subchorionic hemorrhage.  Advised the patient that she should have close follow-up with her OB.  Will prescribe vaginal suppository miconazole in order to avoid oral antifungals in  pregnancy.  Recommended that she still finish her course of Keflex.  The patient will return to the emergency department with any acute worsening but otherwise will follow up with OB.  Patient is amenable with this plan is stable this time for outpatient therapy.      ____________________________________________   FINAL CLINICAL IMPRESSION(S) / ED DIAGNOSES  Final diagnoses:  Yeast vaginitis  [redacted] weeks gestation of pregnancy  Subchorionic hemorrhage of placenta in first trimester, single or unspecified fetus     ED Discharge Orders         Ordered    miconazole (MICONAZOLE 7) 100 MG vaginal suppository  Daily at bedtime        08/27/20 2130          *Please note:  MIKERIA VALIN was evaluated in Emergency Department on 08/27/2020 for the symptoms described in the history of present illness. She was evaluated in the context of the global COVID-19 pandemic, which necessitated consideration that the patient might be at risk for infection with the SARS-CoV-2 virus that causes COVID-19. Institutional protocols and algorithms that pertain to the evaluation of patients at risk for COVID-19 are in a state of rapid change based on information released by regulatory bodies including the CDC and federal and state organizations. These policies and algorithms were followed during the patient's care in the ED.  Some ED evaluations and interventions may be delayed as a result of limited staffing during and the pandemic.*   Note:  This document was prepared using Dragon voice recognition software and may include unintentional dictation errors.    Lucy Chris, PA 08/27/20 7591    Phineas Semen, MD 08/27/20 [phone]

## 2020-08-27 NOTE — ED Notes (Signed)
Pt given swab to self swab for wet prep- [place] at bedside

## 2020-08-29 LAB — URINE CULTURE: Culture: 5000 — AB

## 2020-09-23 NOTE — L&D Delivery Note (Signed)
Delivery Summary for Courtney [name]  Labor Events:   Preterm labor: No data found  Rupture date: 03/03/2021  Rupture time: 1:43 PM  Rupture type: Spontaneous  Fluid Color: Clear White  Induction: No data found  Augmentation: No data found  Complications: No data found  Cervical ripening: No data found No data found   No data found     Delivery:   Episiotomy: No data found  Lacerations: No data found  Repair suture: No data found  Repair # of packets: No data found  Blood loss (ml): 150   Information for the patient's newborn:  Courtney [name], Courtney [name] [[id number]]   Delivery 03/03/2021 5:22 PM by  Vaginal, Spontaneous Sex:  female Gestational Age: [redacted]w[redacted]d Delivery Clinician:   Living?:         APGARS  One minute Five minutes Ten minutes  Skin color:        Heart rate:        Grimace:        Muscle tone:        Breathing:        Totals: 8  9      Presentation/position:      Resuscitation:   Cord information:    Disposition of cord blood:     Blood gases sent?  Complications:   Placenta: Delivered:       appearance Newborn Measurements: Weight: 7 lb 4.1 oz (3290 g)  Height: 19.88"  Head circumference:    Chest circumference:    Other providers:    Additional  information: Forceps:   Vacuum:   Breech:   Observed anomalies      Delivery Note At 5:22 PM a viable and healthy female was delivered via Vaginal, Spontaneous (Presentation: Vertex, Left Occiput Anterior).  APGAR: 8, 9; weight 3290 grams.   Placenta status: Spontaneous, Intact.  Cord: 3 vessels with the following complications: None.  Cord pH: not obtained.  Delayed cord clamping observed > 2 minutes.   Anesthesia: Epidural Episiotomy: None Lacerations: Vaginal; 1st degree Suture Repair: 3.0 vicryl Est. Blood Loss (mL): 150  Mom to postpartum.  Baby to Couplet care / Skin to Skin.  Hildred Laser, MD 03/03/2021, 5:44 PM

## 2020-11-22 ENCOUNTER — Observation Stay
Admission: EM | Admit: 2020-11-22 | Discharge: 2020-11-22 | Disposition: A | Discharge status: Home / Self Care | Payer: Medicaid Other | Attending: Obstetrics and Gynecology | Admitting: Obstetrics and Gynecology

## 2020-11-22 DIAGNOSIS — Z3A23 23 weeks gestation of pregnancy: Secondary | ICD-10-CM

## 2020-11-22 DIAGNOSIS — Z3490 Encounter for supervision of normal pregnancy, unspecified, unspecified trimester: Secondary | ICD-10-CM

## 2020-11-22 DIAGNOSIS — O26892 Other specified pregnancy related conditions, second trimester: Secondary | ICD-10-CM | POA: Diagnosis not present

## 2020-11-22 DIAGNOSIS — R102 Pelvic and perineal pain: Secondary | ICD-10-CM

## 2020-11-22 NOTE — Discharge Instructions (Signed)
Follow-up with your OBGYN

## 2020-11-22 NOTE — OB Triage Note (Signed)
Pt reports to unit c/o right sided lower abdominal pain that suddenly occurred this am around 0350. Pt reports getting prenatal care at [facility] in [place], [place]. Pt reports +FM, denies ctx, vaginal bleeding, or LOF. Pt also denies anything in the vagina in the last 24 hours. Vital signs WDL. External monitors applied and assessing. FHT 140s.

## 2020-11-22 NOTE — Discharge Summary (Signed)
    L&D OB Triage Note  SUBJECTIVE Courtney [name] is a 35 y.o. G1P0 female at [redacted]w[redacted]d, EDD Estimated Date of Delivery: 03/16/21 who presented to triage with complaints of sudden onset of acute right sided abdominal pain.  This then became more achy then stabbing.  This occurred immediately after rolling over in bed.  Patient denies leakage of fluid or vaginal bleeding.  Denies contractions.  OB History  Gravida Para Term Preterm AB Living  1 0 0 0 0 0  SAB IAB Ectopic Multiple Live Births  0 0 0 0 0    # Outcome Date GA Lbr Len/2nd Weight Sex Delivery Anes PTL Lv  1 Current             No medications prior to admission.     OBJECTIVE  Nursing Evaluation:   BP 103/67 (BP Location: Left Arm)   Pulse 79   Temp 98.3 F (36.8 C) (Oral)   Resp 18   Ht 5\' 6"  (1.676 m)   Wt 64.4 kg   LMP 05/11/2020 (Exact Date)   BMI 22.92 kg/m    Findings:        No contractions noted     Likely round ligament pain based on description.      NST was performed and has been reviewed by me.  NST INTERPRETATION: Appropriate for gestational age  Mode: External Baseline Rate (A): 135 bpm Variability: Moderate Accelerations: 10 x 10 Decelerations: None     Contraction Frequency (min): none noted  ASSESSMENT Impression:  1.  Pregnancy:  G1P0 at [redacted]w[redacted]d , EDD Estimated Date of Delivery: 03/16/21 2.  Reassuring fetal and maternal status 3.  Round ligament pain  PLAN 1. Discussed current condition and above findings with patient and reassurance given.  All questions answered. 2. Discharge home with standard labor precautions given to return to L&D or call the office for problems. 3. Continue routine prenatal care.

## 2020-12-25 LAB — OB RESULTS CONSOLE HIV ANTIBODY (ROUTINE TESTING): HIV: NONREACTIVE

## 2020-12-25 LAB — OB RESULTS CONSOLE RPR: RPR: NONREACTIVE

## 2021-01-28 ENCOUNTER — Other Ambulatory Visit: Payer: Self-pay

## 2021-01-28 ENCOUNTER — Observation Stay
Admission: EM | Admit: 2021-01-28 | Discharge: 2021-01-28 | Disposition: A | Discharge status: Home / Self Care | Payer: Medicaid Other | Attending: Obstetrics and Gynecology | Admitting: Obstetrics and Gynecology

## 2021-01-28 DIAGNOSIS — O4703 False labor before 37 completed weeks of gestation, third trimester: Secondary | ICD-10-CM | POA: Diagnosis not present

## 2021-01-28 DIAGNOSIS — Z3A33 33 weeks gestation of pregnancy: Secondary | ICD-10-CM | POA: Diagnosis not present

## 2021-01-28 DIAGNOSIS — O26893 Other specified pregnancy related conditions, third trimester: Secondary | ICD-10-CM | POA: Diagnosis present

## 2021-01-28 DIAGNOSIS — O368331 Maternal care for abnormalities of the fetal heart rate or rhythm, third trimester, fetus 1: Secondary | ICD-10-CM | POA: Diagnosis not present

## 2021-01-28 DIAGNOSIS — R198 Other specified symptoms and signs involving the digestive system and abdomen: Secondary | ICD-10-CM

## 2021-01-28 LAB — WET PREP, GENITAL
Clue Cells Wet Prep HPF POC: NONE SEEN
Sperm: NONE SEEN
Trich, Wet Prep: NONE SEEN
WBC, Wet Prep HPF POC: NONE SEEN
Yeast Wet Prep HPF POC: NONE SEEN

## 2021-01-28 NOTE — OB Triage Note (Signed)
Pt is a G1PO with lower pelvic abdominal pain which started Friday, but has increased in intensity last night through today. Pt states that she is a [facility] pt and last saw Dr Logan Bores here in March for round ligament pain. Pt reports no fluid leaking, blood loss, and has had + fetal movent.

## 2021-01-28 NOTE — Discharge Instructions (Signed)
Please follow up with your OBGYN about your visit. Remember to hydrate and rest.

## 2021-01-31 NOTE — Discharge Summary (Signed)
    L&D OB Triage Note  SUBJECTIVE Courtney [name] is a 35 y.o. G1P0 female at [redacted]w[redacted]d, EDD Estimated Date of Delivery: 03/16/21 who presented to triage with complaints of pelvic and intermittent abdominal pain.  Possible contractions.  Denies bleeding, ROM.  OB History  Gravida Para Term Preterm AB Living  1 0 0 0 0 0  SAB IAB Ectopic Multiple Live Births  0 0 0 0 0    # Outcome Date GA Lbr Len/2nd Weight Sex Delivery Anes PTL Lv  1 Current             No medications prior to admission.     OBJECTIVE  Nursing Evaluation:   BP 107/69 (BP Location: Left Arm)   Pulse 99   Temp 98.5 F (36.9 C) (Oral)   Resp 17   Ht 5\' 6"  (1.676 m)   Wt 73.9 kg   LMP 05/11/2020 (Exact Date)   BMI 26.31 kg/m    Findings:        Patient not in labor.     Very occasional  irregular contractions      NST was performed and has been reviewed by me.  NST INTERPRETATION: Category I  Mode: External Baseline Rate (A): 150 bpm (FHT) Variability: Moderate Accelerations: 15 x 15 Decelerations: None     Contraction Frequency (min): 7.5-16  ASSESSMENT Impression:  1.  Pregnancy:  G1P0 at [redacted]w[redacted]d , EDD Estimated Date of Delivery: 03/16/21 2.  Reassuring fetal and maternal status   PLAN 1. Discussed current condition and above findings with patient and reassurance given.  All questions answered. 2. Discharge home with standard labor precautions given to return to L&D or call the office for problems. 3. Continue routine prenatal care.   Patient has made it clear that she plans to deliver at [facility].  Advised to seek a practice for prenatal care that has hospital privileges at [facility].

## 2021-02-21 LAB — OB RESULTS CONSOLE GBS: GBS: POSITIVE

## 2021-02-27 ENCOUNTER — Encounter: Payer: Self-pay | Admitting: Obstetrics and Gynecology

## 2021-02-27 ENCOUNTER — Observation Stay
Admission: EM | Admit: 2021-02-27 | Discharge: 2021-02-27 | Disposition: A | Discharge status: Home / Self Care | Payer: Medicaid Other | Attending: Obstetrics and Gynecology | Admitting: Obstetrics and Gynecology

## 2021-02-27 ENCOUNTER — Other Ambulatory Visit: Payer: Self-pay

## 2021-02-27 DIAGNOSIS — B9689 Other specified bacterial agents as the cause of diseases classified elsewhere: Secondary | ICD-10-CM | POA: Insufficient documentation

## 2021-02-27 DIAGNOSIS — O479 False labor, unspecified: Secondary | ICD-10-CM | POA: Diagnosis present

## 2021-02-27 DIAGNOSIS — O471 False labor at or after 37 completed weeks of gestation: Principal | ICD-10-CM

## 2021-02-27 DIAGNOSIS — O23593 Infection of other part of genital tract in pregnancy, third trimester: Secondary | ICD-10-CM | POA: Diagnosis not present

## 2021-02-27 DIAGNOSIS — Z3A37 37 weeks gestation of pregnancy: Secondary | ICD-10-CM | POA: Diagnosis not present

## 2021-02-27 LAB — URINALYSIS, ROUTINE W REFLEX MICROSCOPIC
Bilirubin Urine: NEGATIVE
Glucose, UA: NEGATIVE mg/dL
Hgb urine dipstick: NEGATIVE
Ketones, ur: NEGATIVE mg/dL
Leukocytes,Ua: NEGATIVE
Nitrite: NEGATIVE
Protein, ur: NEGATIVE mg/dL
Specific Gravity, Urine: 1.006 (ref 1.005–1.030)
pH: 7 (ref 5.0–8.0)

## 2021-02-27 LAB — WET PREP, GENITAL
Clue Cells Wet Prep HPF POC: NONE SEEN
Sperm: NONE SEEN
Trich, Wet Prep: NONE SEEN
Yeast Wet Prep HPF POC: NONE SEEN

## 2021-02-27 LAB — CHLAMYDIA/NGC RT PCR (ARMC ONLY)
Chlamydia Tr: NOT DETECTED
N gonorrhoeae: NOT DETECTED

## 2021-02-27 MED ORDER — TRAMADOL HCL 50 MG PO TABS
100.0000 mg | ORAL_TABLET | Freq: Once | ORAL | Status: AC
  Administered 2021-02-27: 100 mg via ORAL
  Filled 2021-02-27: qty 2

## 2021-02-27 MED ORDER — LACTATED RINGERS IV BOLUS: 1000.0000 mL | Freq: Once | INTRAVENOUS | Status: DC

## 2021-02-27 MED ORDER — HYDROXYZINE HCL 25 MG PO TABS
25.0000 mg | ORAL_TABLET | Freq: Once | ORAL | Status: AC
  Administered 2021-02-27: 25 mg via ORAL
  Filled 2021-02-27: qty 1

## 2021-02-27 MED ORDER — HYDROXYZINE HCL 25 MG PO TABS: 25.0000 mg | ORAL_TABLET | Freq: Three times a day (TID) | ORAL | Status: DC | PRN

## 2021-02-27 NOTE — OB Triage Note (Signed)
Notified provider that patient is complain of pain. Strip reviewed by OB. New orders given see MAR.

## 2021-02-27 NOTE — Discharge Instructions (Signed)

## 2021-02-27 NOTE — Final Progress Note (Signed)
L&D OB Triage Note  HPI:  Courtney [name] is a 35 y.o. G1P0 female at [redacted]w[redacted]d. Estimated Date of Delivery: 03/16/21 who presents for complaints of cramping and contractions with onset today. She currently receives [facility], however notes that she plans to deliver at [facility] because it is closer.  No major concerns this pregnancy.  She reports good fetal movement and denies vaginal bleeding or leaking fluid.   OB History  Gravida Para Term Preterm AB Living  1            SAB IAB Ectopic Multiple Live Births               # Outcome Date GA Lbr Len/2nd Weight Sex Delivery Anes PTL Lv  1 Current             Patient Active Problem List   Diagnosis Date Noted  . Uterine contractions 02/27/2021  . Abdominal complaints 01/28/2021  . Pregnancy 11/22/2020  . Syncope 07/23/2017  . 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 CIN II (cervical intraepithelial neoplasia II) 09/08/2013    Past Medical History:  Diagnosis Date  . Menorrhagia   . Seizures (HCC)    last seizure 2021; tonic clonic    No current facility-administered medications on file prior to encounter.   Current Outpatient Medications on File Prior to Encounter  Medication Sig Dispense Refill  . Prenatal Vit-Fe Fumarate-FA (PRENATAL MULTIVITAMIN) TABS tablet Take 1 tablet by mouth daily at 12 noon.    . folic acid (FOLVITE) 800 MCG tablet Take 400 mcg by mouth daily. (Patient not taking: Reported on 02/27/2021)    . miconazole (MICONAZOLE 7) 100 MG vaginal suppository Place 1 suppository (100 mg total) vaginally at bedtime. (Patient not taking: Reported on 02/27/2021) 7 suppository 0  . Multiple Vitamin (MULTIVITAMIN) capsule Take 1 capsule by mouth daily. (Patient not taking: Reported on 02/27/2021) 100 capsule 0  . vitamin C (ASCORBIC ACID) 500 MG tablet Take 500 mg by mouth daily. (Patient not taking: Reported on 02/27/2021)      No Known Allergies   ROS:  General:Not Present- Fever, Weight Loss and Weight  Gain. Skin:Not Present- Rash. HEENT:Not Present- Blurred Vision, Headache and Bleeding Gums. Respiratory:Not Present- Difficulty Breathing. Breast:Not Present- Breast Mass. Cardiovascular:Not Present- Chest Pain, Elevated Blood Pressure, Fainting / Blacking Out and Shortness of Breath. Gastrointestinal:Not Present- Abdominal Pain, Constipation, Nausea and Vomiting. Female Genitourinary:Not Present- Frequency, Painful Urination, Pelvic Pain, Vaginal Bleeding, Vaginal Discharge, regular, Fetal Movements Decreased, Urinary Complaints and Vaginal Fluid. Musculoskeletal:Not Present- Back Pain and Leg Cramps. Neurological:Not Present- Dizziness. Psychiatric:Not Present- Depression.     Physical Exam:  Blood pressure 111/70, pulse 98, temperature 98 F (36.7 C), temperature source Oral, resp. rate 18, last menstrual period 05/11/2020. General appearance: alert and no distress Abdomen: soft, non-tender; bowel sounds normal; no masses,  no organomegaly Pelvic:  4/60/-3 (previously 3/thick/-3 on initial presentation), performed by nurse. Thick white/green vaginal discharge present on glove.  Extremities: extremities normal, atraumatic, no cyanosis or edema   NST INTERPRETATION: Indications: rule out uterine contractions  Mode: External Baseline Rate (A): 145 bpm Variability: Moderate Accelerations: 15 x 15 Decelerations: None     Contraction Frequency (min): 3-7  Impression: reactive    Labs:  Results for orders placed or performed during the hospital encounter of 02/27/21  Wet prep, genital   Specimen: Vaginal  Result Value Ref Range   Yeast Wet Prep HPF POC NONE SEEN NONE SEEN   Trich, Wet Prep  NONE SEEN NONE SEEN   Clue Cells Wet Prep HPF POC NONE SEEN NONE SEEN   WBC, Wet Prep HPF POC FEW (A) NONE SEEN   Sperm NONE SEEN   Chlamydia/NGC rt PCR ([facility] only)   Specimen: Cervical/Vaginal swab; Genital  Result Value Ref Range   Specimen source GC/Chlam ENDOCERVICAL     Chlamydia Tr NOT DETECTED NOT DETECTED   N gonorrhoeae NOT DETECTED NOT DETECTED  OB RESULT CONSOLE Group B Strep  Result Value Ref Range   GBS Positive   Urinalysis, Routine w reflex microscopic Urine, Clean Catch  Result Value Ref Range   Color, Urine STRAW (A) YELLOW   APPearance CLEAR (A) CLEAR   Specific Gravity, Urine 1.006 1.005 - 1.030   pH 7.0 5.0 - 8.0   Glucose, UA NEGATIVE NEGATIVE mg/dL   Hgb urine dipstick NEGATIVE NEGATIVE   Bilirubin Urine NEGATIVE NEGATIVE   Ketones, ur NEGATIVE NEGATIVE mg/dL   Protein, ur NEGATIVE NEGATIVE mg/dL   Nitrite NEGATIVE NEGATIVE   Leukocytes,Ua NEGATIVE NEGATIVE  OB RESULTS CONSOLE RPR  Result Value Ref Range   RPR Nonreactive   OB RESULTS CONSOLE HIV antibody  Result Value Ref Range   HIV Non-reactive   OB RESULTS CONSOLE Rubella Antibody  Result Value Ref Range   Rubella Immune   OB RESULTS CONSOLE GC/Chlamydia  Result Value Ref Range   Gonorrhea Negative    Chlamydia Negative   OB RESULTS CONSOLE RPR  Result Value Ref Range   RPR Nonreactive   OB RESULTS CONSOLE HIV antibody  Result Value Ref Range   HIV Non-reactive   OB RESULTS CONSOLE Hepatitis B surface antigen  Result Value Ref Range   Hepatitis B Surface Ag Negative     Assessment:  35 y.o. G1P0 at [redacted]w[redacted]d with:  1.  Threatened labor at term 2. Vaginal discharge 3. Category II fetal tracing  Plan:  1. Patient with additional 4-6 hrs of monitoring to assess for further cervical change, however stalled at 4 cm. Contractions spaced from 3-7 minutes, to greater than 10 minutes apart with IV bolus. Patient not deemed in labor. She was still noting pain with contractions, so given Tramadol 100 mg and Vistaril 25 mg with improvement in symptoms.  2.  Vaginal discharge noted on exam by nurse, vaginal swabs all returned negative.  3. Category II tracing initially noted, with several variable decelerations and 1 late deceleration, improved after fluid bolus and patient  allowed to eat.   Is deemed stable for discharge as fetal tracing is now reassuring and no further signs of labor.    Hildred Laser, MD [facility]

## 2021-02-28 DIAGNOSIS — O471 False labor at or after 37 completed weeks of gestation: Secondary | ICD-10-CM | POA: Diagnosis not present

## 2021-02-28 NOTE — Progress Notes (Signed)
Patient discharged to home. Patient verbalized understanding of labor precautions. Patient was wheeled to ED lobby where she will be transported by private passenger vehicle. Patient thankful for care. L&D triage complete.

## 2021-03-03 ENCOUNTER — Inpatient Hospital Stay: Payer: Medicaid Other | Admitting: Anesthesiology

## 2021-03-03 ENCOUNTER — Encounter: Payer: Self-pay | Admitting: Obstetrics and Gynecology

## 2021-03-03 ENCOUNTER — Inpatient Hospital Stay
Admission: EM | Admit: 2021-03-03 | Discharge: 2021-03-04 | DRG: 807 | Disposition: A | Discharge status: Home / Self Care | Payer: Medicaid Other | Attending: Obstetrics and Gynecology | Admitting: Obstetrics and Gynecology

## 2021-03-03 ENCOUNTER — Other Ambulatory Visit: Payer: Self-pay

## 2021-03-03 DIAGNOSIS — O9902 Anemia complicating childbirth: Secondary | ICD-10-CM | POA: Diagnosis present

## 2021-03-03 DIAGNOSIS — O98813 Other maternal infectious and parasitic diseases complicating pregnancy, third trimester: Secondary | ICD-10-CM | POA: Diagnosis not present

## 2021-03-03 DIAGNOSIS — Z20822 Contact with and (suspected) exposure to covid-19: Secondary | ICD-10-CM | POA: Diagnosis present

## 2021-03-03 DIAGNOSIS — Z3A38 38 weeks gestation of pregnancy: Secondary | ICD-10-CM | POA: Diagnosis not present

## 2021-03-03 DIAGNOSIS — O26893 Other specified pregnancy related conditions, third trimester: Secondary | ICD-10-CM | POA: Diagnosis present

## 2021-03-03 DIAGNOSIS — O99013 Anemia complicating pregnancy, third trimester: Secondary | ICD-10-CM

## 2021-03-03 DIAGNOSIS — O99824 Streptococcus B carrier state complicating childbirth: Principal | ICD-10-CM | POA: Diagnosis present

## 2021-03-03 DIAGNOSIS — O99820 Streptococcus B carrier state complicating pregnancy: Secondary | ICD-10-CM

## 2021-03-03 LAB — TYPE AND SCREEN
ABO/RH(D): B POS
Antibody Screen: NEGATIVE

## 2021-03-03 LAB — RESP PANEL BY RT-PCR (FLU A&B, COVID) ARPGX2
Influenza A by PCR: NEGATIVE
Influenza B by PCR: NEGATIVE
SARS Coronavirus 2 by RT PCR: NEGATIVE

## 2021-03-03 LAB — CBC
HCT: 31.3 % — ABNORMAL LOW (ref 36.0–46.0)
Hemoglobin: 10.7 g/dL — ABNORMAL LOW (ref 12.0–15.0)
MCH: 31.5 pg (ref 26.0–34.0)
MCHC: 34.2 g/dL (ref 30.0–36.0)
MCV: 92.1 fL (ref 80.0–100.0)
Platelets: 263 10*3/uL (ref 150–400)
RBC: 3.4 MIL/uL — ABNORMAL LOW (ref 3.87–5.11)
RDW: 13.2 % (ref 11.5–15.5)
WBC: 14.8 10*3/uL — ABNORMAL HIGH (ref 4.0–10.5)
nRBC: 0 % (ref 0.0–0.2)

## 2021-03-03 LAB — ABO/RH: ABO/RH(D): B POS

## 2021-03-03 MED ORDER — SIMETHICONE 80 MG PO CHEW: 80.0000 mg | CHEWABLE_TABLET | ORAL | Status: DC | PRN

## 2021-03-03 MED ORDER — LACTATED RINGERS IV SOLN
500.0000 mL | Freq: Once | INTRAVENOUS | Status: AC
  Administered 2021-03-03: 500 mL via INTRAVENOUS

## 2021-03-03 MED ORDER — SOD CITRATE-CITRIC ACID 500-334 MG/5ML PO SOLN: 30.0000 mL | ORAL | Status: DC | PRN

## 2021-03-03 MED ORDER — OXYCODONE-ACETAMINOPHEN 5-325 MG PO TABS: 2.0000 | ORAL_TABLET | ORAL | Status: DC | PRN

## 2021-03-03 MED ORDER — MISOPROSTOL 200 MCG PO TABS
ORAL_TABLET | ORAL | Status: AC
  Filled 2021-03-03: qty 4

## 2021-03-03 MED ORDER — PHENYLEPHRINE 40 MCG/ML (10ML) SYRINGE FOR IV PUSH (FOR BLOOD PRESSURE SUPPORT): 80.0000 ug | PREFILLED_SYRINGE | INTRAVENOUS | Status: DC | PRN

## 2021-03-03 MED ORDER — FENTANYL 2.5 MCG/ML W/ROPIVACAINE 0.15% IN NS 100 ML EPIDURAL (ARMC)
EPIDURAL | Status: AC
  Filled 2021-03-03: qty 100

## 2021-03-03 MED ORDER — WITCH HAZEL-GLYCERIN EX PADS: 1.0000 "application " | MEDICATED_PAD | CUTANEOUS | Status: DC | PRN

## 2021-03-03 MED ORDER — OXYTOCIN-SODIUM CHLORIDE 30-0.9 UT/500ML-% IV SOLN
2.5000 [IU]/h | INTRAVENOUS | Status: DC
  Administered 2021-03-03: 2.5 [IU]/h via INTRAVENOUS

## 2021-03-03 MED ORDER — BENZOCAINE-MENTHOL 20-0.5 % EX AERO: 1.0000 "application " | INHALATION_SPRAY | CUTANEOUS | Status: DC | PRN

## 2021-03-03 MED ORDER — SENNOSIDES-DOCUSATE SODIUM 8.6-50 MG PO TABS
2.0000 | ORAL_TABLET | Freq: Every day | ORAL | Status: DC
  Administered 2021-03-04: 2 via ORAL
  Filled 2021-03-03: qty 2

## 2021-03-03 MED ORDER — ACETAMINOPHEN 325 MG PO TABS: 650.0000 mg | ORAL_TABLET | ORAL | Status: DC | PRN

## 2021-03-03 MED ORDER — DIPHENHYDRAMINE HCL 25 MG PO CAPS: 25.0000 mg | ORAL_CAPSULE | Freq: Four times a day (QID) | ORAL | Status: DC | PRN

## 2021-03-03 MED ORDER — EPHEDRINE 5 MG/ML INJ: 10.0000 mg | INTRAVENOUS | Status: DC | PRN

## 2021-03-03 MED ORDER — SODIUM CHLORIDE 0.9 % IV SOLN
1.0000 g | INTRAVENOUS | Status: DC
  Administered 2021-03-03: 1 g via INTRAVENOUS
  Filled 2021-03-03: qty 1000

## 2021-03-03 MED ORDER — LACTATED RINGERS IV SOLN
500.0000 mL | INTRAVENOUS | Status: DC | PRN
  Administered 2021-03-03: 1000 mL via INTRAVENOUS

## 2021-03-03 MED ORDER — LACTATED RINGERS IV SOLN: INTRAVENOUS | Status: DC

## 2021-03-03 MED ORDER — DIPHENHYDRAMINE HCL 50 MG/ML IJ SOLN
12.5000 mg | INTRAMUSCULAR | Status: DC | PRN
Start: 2021-03-03 — End: 2021-03-03

## 2021-03-03 MED ORDER — LIDOCAINE HCL (PF) 1 % IJ SOLN
INTRAMUSCULAR | Status: AC
  Filled 2021-03-03: qty 30

## 2021-03-03 MED ORDER — ONDANSETRON HCL 4 MG PO TABS
4.0000 mg | ORAL_TABLET | ORAL | Status: DC | PRN
  Filled 2021-03-03: qty 1

## 2021-03-03 MED ORDER — OXYTOCIN 10 UNIT/ML IJ SOLN
INTRAMUSCULAR | Status: AC
  Filled 2021-03-03: qty 2

## 2021-03-03 MED ORDER — ACETAMINOPHEN 325 MG PO TABS
650.0000 mg | ORAL_TABLET | ORAL | Status: DC | PRN
Start: 2021-03-03 — End: 2021-03-05
  Administered 2021-03-04: 650 mg via ORAL
  Filled 2021-03-03: qty 2

## 2021-03-03 MED ORDER — FENTANYL 2.5 MCG/ML W/ROPIVACAINE 0.15% IN NS 100 ML EPIDURAL (ARMC)
EPIDURAL | Status: DC | PRN
  Administered 2021-03-03: 12 mL/h via EPIDURAL

## 2021-03-03 MED ORDER — OXYCODONE-ACETAMINOPHEN 5-325 MG PO TABS: 1.0000 | ORAL_TABLET | ORAL | Status: DC | PRN

## 2021-03-03 MED ORDER — IBUPROFEN 600 MG PO TABS
600.0000 mg | ORAL_TABLET | Freq: Four times a day (QID) | ORAL | Status: DC
  Administered 2021-03-03 – 2021-03-04 (×4): 600 mg via ORAL
  Filled 2021-03-03 (×4): qty 1

## 2021-03-03 MED ORDER — FENTANYL-BUPIVACAINE-NACL 0.5-0.125-0.9 MG/250ML-% EP SOLN
12.0000 mL/h | EPIDURAL | Status: DC | PRN
  Administered 2021-03-03: 12 mL/h via EPIDURAL

## 2021-03-03 MED ORDER — AMMONIA AROMATIC IN INHA
RESPIRATORY_TRACT | Status: AC
  Filled 2021-03-03: qty 10

## 2021-03-03 MED ORDER — SODIUM CHLORIDE 0.9 % IV SOLN
2.0000 g | Freq: Once | INTRAVENOUS | Status: AC
  Administered 2021-03-03: 2 g via INTRAVENOUS
  Filled 2021-03-03: qty 2000

## 2021-03-03 MED ORDER — ONDANSETRON HCL 4 MG/2ML IJ SOLN: 4.0000 mg | INTRAMUSCULAR | Status: DC | PRN

## 2021-03-03 MED ORDER — OXYTOCIN BOLUS FROM INFUSION
333.0000 mL | Freq: Once | INTRAVENOUS | Status: AC
Start: 2021-03-03 — End: 2021-03-03

## 2021-03-03 MED ORDER — ONDANSETRON HCL 4 MG/2ML IJ SOLN: 4.0000 mg | Freq: Four times a day (QID) | INTRAMUSCULAR | Status: DC | PRN

## 2021-03-03 MED ORDER — DIBUCAINE (PERIANAL) 1 % EX OINT: 1.0000 "application " | TOPICAL_OINTMENT | CUTANEOUS | Status: DC | PRN

## 2021-03-03 MED ORDER — PRENATAL MULTIVITAMIN CH
1.0000 | ORAL_TABLET | Freq: Every day | ORAL | Status: DC
  Administered 2021-03-04: 1 via ORAL
  Filled 2021-03-03: qty 1

## 2021-03-03 MED ORDER — ZOLPIDEM TARTRATE 5 MG PO TABS: 5.0000 mg | ORAL_TABLET | Freq: Every evening | ORAL | Status: DC | PRN

## 2021-03-03 MED ORDER — LIDOCAINE-EPINEPHRINE (PF) 1.5 %-1:200000 IJ SOLN
INTRAMUSCULAR | Status: DC | PRN
  Administered 2021-03-03: 3 mL via PERINEURAL

## 2021-03-03 MED ORDER — BUTORPHANOL TARTRATE 1 MG/ML IJ SOLN
1.0000 mg | Freq: Once | INTRAMUSCULAR | Status: AC
  Administered 2021-03-03: 1 mg via INTRAVENOUS
  Filled 2021-03-03: qty 1

## 2021-03-03 MED ORDER — LIDOCAINE HCL (PF) 1 % IJ SOLN: 30.0000 mL | INTRAMUSCULAR | Status: DC | PRN

## 2021-03-03 MED ORDER — COCONUT OIL OIL
1.0000 "application " | TOPICAL_OIL | Status: DC | PRN
  Administered 2021-03-04: 1 via TOPICAL
  Filled 2021-03-03 (×2): qty 120

## 2021-03-03 MED ORDER — OXYTOCIN-SODIUM CHLORIDE 30-0.9 UT/500ML-% IV SOLN
INTRAVENOUS | Status: AC
  Administered 2021-03-03: 333 mL via INTRAVENOUS
  Filled 2021-03-03: qty 500

## 2021-03-03 NOTE — Plan of Care (Signed)
Transferred to Room 335 PP> alert and oriented with pleasant affect. Oriented to Room, POC, Safety and Security and Fall Prevention and Pt. V/O.

## 2021-03-03 NOTE — Anesthesia Procedure Notes (Signed)
Epidural Patient location during procedure: OB Start time: 03/03/2021 12:47 PM End time: 03/03/2021 1:00 PM  Staffing Anesthesiologist: Felicita Gage, MD Performed: anesthesiologist   Preanesthetic Checklist Completed: patient identified, IV checked, site marked, risks and benefits discussed, surgical consent, monitors and equipment checked, pre-op evaluation and timeout performed  Epidural Patient position: sitting Prep: ChloraPrep Patient monitoring: heart rate, continuous pulse ox and blood pressure Approach: midline Location: L3-L4 Injection technique: LOR saline  Needle:  Needle type: Tuohy  Needle gauge: 17 G Needle length: 9 cm and 9 Needle insertion depth: 5 cm Catheter type: closed end flexible Catheter size: 19 Gauge Catheter at skin depth: 10 cm Test dose: negative and 1.5% lidocaine with Epi 1:200 K  Assessment Sensory level: T10 Events: blood not aspirated, injection not painful, no injection resistance, no paresthesia and negative IV test  Additional Notes 1 attempt Pt. Evaluated and documentation done after procedure finished. Patient identified. Risks/Benefits/Options discussed with patient including but not limited to bleeding, infection, nerve damage, paralysis, failed block, incomplete pain control, headache, blood pressure changes, nausea, vomiting, reactions to medication both or allergic, itching and postpartum back pain. Confirmed with bedside nurse the patient's most recent platelet count. Confirmed with patient that they are not currently taking any anticoagulation, have any bleeding history or any family history of bleeding disorders. Patient expressed understanding and wished to proceed. All questions were answered. Sterile technique was used throughout the entire procedure. Please see nursing notes for vital signs. Test dose was given through epidural catheter and negative prior to continuing to dose epidural or start infusion. Warning signs of high  block given to the patient including shortness of breath, tingling/numbness in hands, complete motor block, or any concerning symptoms with instructions to call for help. Patient was given instructions on fall risk and not to get out of bed. All questions and concerns addressed with instructions to call with any issues or inadequate analgesia.    Patient tolerated the insertion well without immediate complications.Reason for block:at surgeon's request and procedure for pain

## 2021-03-03 NOTE — Anesthesia Postprocedure Evaluation (Signed)
Anesthesia Post Note  Patient: Courtney [name]  Procedure(s) Performed: AN AD HOC LABOR EPIDURAL  Patient location during evaluation: Mother Baby Anesthesia Type: Epidural Level of consciousness: awake and alert Pain management: pain level controlled Vital Signs Assessment: post-procedure vital signs reviewed and stable Respiratory status: spontaneous breathing, nonlabored ventilation and respiratory function stable Cardiovascular status: stable Postop Assessment: no headache, no backache and epidural receding Anesthetic complications: no   No notable events documented.   Last Vitals:  Vitals:   03/03/21 2025 03/03/21 2134  BP: 113/68 125/80  Pulse: (!) 103 (!) 104  Resp: 20 18  Temp: (!) 36.4 C 36.7 C  SpO2: 100% 100%    Last Pain:  Vitals:   03/03/21 2139  TempSrc:   PainSc: 0-No pain                 Felicita Gage

## 2021-03-03 NOTE — Anesthesia Preprocedure Evaluation (Signed)
Anesthesia Evaluation  Patient identified by MRN, date of birth, ID band Patient awake    Reviewed: Allergy & Precautions, NPO status , Patient's Chart, lab work & pertinent test results  History of Anesthesia Complications Negative for: history of anesthetic complications  Airway Mallampati: I  TM Distance: >3 FB Neck ROM: Full    Dental no notable dental hx.    Pulmonary neg pulmonary ROS,    Pulmonary exam normal breath sounds clear to auscultation       Cardiovascular Exercise Tolerance: Good negative cardio ROS Normal cardiovascular exam Rhythm:Regular Rate:Normal     Neuro/Psych Seizures -,  negative psych ROS   GI/Hepatic negative GI ROS, Neg liver ROS,   Endo/Other  negative endocrine ROS  Renal/GU negative Renal ROS  negative genitourinary   Musculoskeletal negative musculoskeletal ROS (+)   Abdominal   Peds negative pediatric ROS (+)  Hematology negative hematology ROS (+)   Anesthesia Other Findings   Reproductive/Obstetrics (+) Pregnancy G1 PUND at term                             Anesthesia Physical Anesthesia Plan  ASA: 2  Anesthesia Plan: Epidural   Post-op Pain Management:    Induction:   PONV Risk Score and Plan:   Airway Management Planned:   Additional Equipment:   Intra-op Plan:   Post-operative Plan:   Informed Consent: I have reviewed the patients History and Physical, chart, labs and discussed the procedure including the risks, benefits and alternatives for the proposed anesthesia with the patient or authorized representative who has indicated his/her understanding and acceptance.       Plan Discussed with: Surgeon  Anesthesia Plan Comments: (Discussed b/r of epidural to include PDPH, high spinal, seizures and conversion to GETA in case of csection if CLE not effective.  Pt wished to proceed.)        Anesthesia Quick Evaluation

## 2021-03-03 NOTE — OB Triage Note (Signed)
Pt c/o losing mucus plug with "a little bit of fluid" and contractions since 0300 this am. Denies vaginal bleeding. Courtney [name]

## 2021-03-03 NOTE — Lactation Note (Signed)
This note was copied from a baby's chart. Lactation Consultation Note  Patient Name: Boy Levaeh Vice [id number] Date: 03/03/2021   Age:33 hours  Maternal Data    Feeding   Mom had just breast fed Lennie Odor and reports he nursed well even after she had given formula for the first feeding.  Demonstrated hand expression of colostrum.  Mom denies any breast or nipple pain.  Discussed feeding cues and supply and demand and encouraged mom to breast feed him whenever he demonstrated feeding cues to bring in mature milk and ensure a plentiful milk supply.  Hand out given on what to expect with breast feeding the first 4 days of life reviewing normal newborn stomach size, normal course of lactation and routine newborn feeding patterns.  Lactation Resource sheet given and reviewed support groups, informational web sites and contact numbers.  Encouraged mom to call with any questions, concerns or assistance. LATCH Score Latch: Repeated attempts needed to sustain latch, nipple held in mouth throughout feeding, stimulation needed to elicit sucking reflex.  Audible Swallowing: None  Type of Nipple: Everted at rest and after stimulation  Comfort (Breast/Nipple): Soft / non-tender  Hold (Positioning): Full assist, staff holds infant at breast  [facility] Score: 5   Lactation Tools Discussed/Used    Interventions    Discharge    Consult Status      Louis Meckel 03/03/2021, 10:48 PM

## 2021-03-03 NOTE — H&P (Signed)
Obstetric History and Physical  Courtney [name] is a 35 y.o. G1P0 with IUP at [redacted]w[redacted]d presenting for complaints of worsening contractions and loss of mucus plug around 0300 this morning. Patient states she has been having  regular, every 3-4 minutes contractions, no vaginal bleeding, intact membranes, with active fetal movement.    Of note, patient receives [facility] in [facility] at [facility] - [facility] Dunk[facility]. Has been seen in [facility] triage several times this pregnancy with plans to delivery here.    Prenatal Course Source of Care:   [facility]. Began PNC at 12 weeks.   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; tonic clonic    Past Surgical History:  Procedure Laterality Date   NO PAST SURGERIES      OB History  Gravida Para Term Preterm AB Living  1            SAB IAB Ectopic Multiple Live Births               # Outcome Date GA Lbr Len/2nd Weight Sex Delivery Anes PTL Lv  1 Current             Social History   Socioeconomic History   Marital status: Single    Spouse name: Not on file    Number of children: Not on file   Years of education: Not on file   Highest education level: Not on file  Occupational History   Not on file  Tobacco Use   Smoking status: Never   Smokeless tobacco: Never  Vaping Use   Vaping Use: Never used  Substance and Sexual Activity   Alcohol use: Yes    Alcohol/week: 2.0 standard drinks    Types: 2 Glasses of wine per week    Comment: occ   Drug use: No   Sexual activity: Yes    Birth control/protection: Condom  Other Topics Concern   Not on file  Social History Narrative   Not on file   Social Determinants of Health   Financial Resource Strain: Not on file  Food Insecurity: Not on file  Transportation Needs: Not on file  Physical Activity: Not on file  Stress: Not on file  Social Connections: Not on file    Family History  Problem Relation Age of Onset   Hypertension Mother     Medications Prior to Admission  Medication Sig Dispense Refill Last Dose   Prenatal Vit-Fe Fumarate-FA (PRENATAL MULTIVITAMIN) TABS tablet Take 1 tablet by mouth daily at 12 noon.   Past Week   folic acid (FOLVITE) 800 MCG tablet Take 400 mcg by mouth daily. (Patient not  taking: Reported on 02/27/2021)      miconazole (MICONAZOLE 7) 100 MG vaginal suppository Place 1 suppository (100 mg total) vaginally at bedtime. (Patient not taking: Reported on 02/27/2021) 7 suppository 0    Multiple Vitamin (MULTIVITAMIN) capsule Take 1 capsule by mouth daily. (Patient not taking: Reported on 02/27/2021) 100 capsule 0    vitamin C (ASCORBIC ACID) 500 MG tablet Take 500 mg by mouth daily. (Patient not taking: Reported on 02/27/2021)       No Known Allergies  Review of Systems: Negative except for what is mentioned in HPI.  Physical Exam: Ht 5\' 6"  (1.676 m)   Wt 72.1 kg   LMP 05/11/2020 (Exact Date)   BMI 25.66 kg/m  CONSTITUTIONAL: Well-developed, well-nourished female in no acute distress.  HENT:  Normocephalic, atraumatic, External right and left ear normal.  Oropharynx is clear and moist EYES: Conjunctivae and EOM are normal. Pupils are equal, round, and reactive to light. No scleral icterus.  NECK: Normal range of motion, supple, no masses SKIN: Skin is warm and dry. No rash noted. Not diaphoretic. No erythema. No pallor. NEUROLOGIC: Alert and oriented to person, place, and time. Normal reflexes, muscle tone coordination. No cranial nerve deficit noted. PSYCHIATRIC: Normal mood and affect. Normal behavior. Normal judgment and thought content. CARDIOVASCULAR: Normal heart rate noted, regular rhythm RESPIRATORY: Effort and breath sounds normal, no problems with respiration noted ABDOMEN: Soft, nontender, nondistended, gravid. MUSCULOSKELETAL: Normal range of motion. No edema and no tenderness. 2+ distal pulses.  Cervical Exam: Dilatation 7 cm   Effacement 90%   Station -2   Presentation: cephalic FHT:  Baseline rate 150 bpm   Variability moderate  Accelerations present   Decelerations none Contractions: Every 2-3 mins   Pertinent Labs/Studies:   Results for orders placed or performed during the hospital encounter of 03/03/21 (from the past 24 hour(s))  Resp Panel by RT-PCR (Flu A&B, Covid) Nasopharyngeal Swab     Status: None   Collection Time: 03/03/21 10:47 AM   Specimen: Nasopharyngeal Swab; Nasopharyngeal(NP) swabs in vial transport medium  Result Value Ref Range   SARS Coronavirus 2 by RT PCR NEGATIVE NEGATIVE   Influenza A by PCR NEGATIVE NEGATIVE   Influenza B by PCR NEGATIVE NEGATIVE  CBC     Status: Abnormal   Collection Time: 03/03/21 11:40 AM  Result Value Ref Range   WBC 14.8 (H) 4.0 - 10.5 K/uL   RBC 3.40 (L) 3.87 - 5.11 MIL/uL   Hemoglobin 10.7 (L) 12.0 - 15.0 g/dL   HCT 05/03/21 (L) 54.6 - 50.3 %   MCV 92.1 80.0 - 100.0 fL   MCH 31.5 26.0 - 34.0 pg   MCHC 34.2 30.0 - 36.0 g/dL   RDW 54.6 56.8 - 12.7 %   Platelets 263 150 - 400 K/uL   nRBC 0.0 0.0 - 0.2 %  Type and screen [facility]     Status: None  (Preliminary result)   Collection Time: 03/03/21 11:40 AM  Result Value Ref Range   ABO/RH(D) PENDING    Antibody Screen PENDING    Sample Expiration      03/06/2021,2359 Performed at [facility], [address] [place], [place]     Assessment : Courtney [name] is a 35 y.o. G1P0 at [redacted]w[redacted]d being admitted for labor.  Mild anemia of pregnancy in third trimester.    Plan: Labor: Expectant management. Augmentation with Pitocin if needed, per protocol. Analgesia as needed.  Desires epidural.  Blood  type is B+ (noted in Care Everywhere) FWB: Reassuring fetal heart tracing.  GBS positive, treating with Ampicillin.  Delivery plan: Hopeful for vaginal delivery    Hildred Laser, MD [facility]

## 2021-03-04 LAB — CBC
HCT: 24.5 % — ABNORMAL LOW (ref 36.0–46.0)
Hemoglobin: 8.2 g/dL — ABNORMAL LOW (ref 12.0–15.0)
MCH: 31.1 pg (ref 26.0–34.0)
MCHC: 33.5 g/dL (ref 30.0–36.0)
MCV: 92.8 fL (ref 80.0–100.0)
Platelets: 194 10*3/uL (ref 150–400)
RBC: 2.64 MIL/uL — ABNORMAL LOW (ref 3.87–5.11)
RDW: 13.4 % (ref 11.5–15.5)
WBC: 15.1 10*3/uL — ABNORMAL HIGH (ref 4.0–10.5)
nRBC: 0 % (ref 0.0–0.2)

## 2021-03-04 LAB — RPR: RPR Ser Ql: NONREACTIVE

## 2021-03-04 MED ORDER — IBUPROFEN 600 MG PO TABS: 600.0000 mg | ORAL_TABLET | Freq: Four times a day (QID) | ORAL | 0 refills | Status: AC

## 2021-03-04 MED ORDER — FERROUS SULFATE 325 (65 FE) MG PO TABS: 325.0000 mg | ORAL_TABLET | Freq: Two times a day (BID) | ORAL | 1 refills | Status: AC

## 2021-03-04 MED ORDER — DOCUSATE SODIUM 100 MG PO CAPS: 100.0000 mg | ORAL_CAPSULE | Freq: Two times a day (BID) | ORAL | 2 refills | Status: AC | PRN

## 2021-03-04 NOTE — Progress Notes (Signed)
All doctor's discharge instructions reviewed with patient; she verbalized understanding of same; copy given.

## 2021-03-04 NOTE — TOC Progression Note (Signed)
Transition of Care [facility]) - Progression Note    Patient Details  Name: Courtney [name] MRN: [id number] Date of Birth: November 16, 1985  Transition of Care [facility]) CM/SW Contact  Marina Goodell Phone Number: [phone] 03/04/2021, 1:47 PM  Clinical Narrative:     CSW  spoke with patient.  Patient is a first-time mother. Patient stated she has good support and lives with her mother.  Patient stated she plans to stay at home with the baby for the foreseeable future.  Patient stated she has a bassinet and is planning to get a crib soon, and all she needs is a car seat.  Patient has Medicare and will follow up with her OBGYN. Patient has already set up baby's Medicaid and WIC. CSW stated the patient can contact DSS or the Health Department for assistance in finding a crib.Patient verbalized understanding and stated she does not have other needs.  CSW updated Unit RN.        Expected Discharge Plan and Services                                                 Social Determinants of Health (SDOH) Interventions    Readmission Risk Interventions No flowsheet data found.

## 2021-03-04 NOTE — Progress Notes (Signed)
Patient discharged to home. Patient left 3rd floor via wheelchair with her infant in her arms accompanied by T. Katrinka Blazing RN and Alexandria Lodge RN.

## 2021-03-04 NOTE — Discharge Instructions (Addendum)

## 2021-03-04 NOTE — Progress Notes (Signed)
Post Partum Day # 1, s/p SVD  Subjective: no complaints, up ad lib, voiding, and tolerating PO  Objective: Temp:  [97.5 F (36.4 C)-98.7 F (37.1 C)] 98.2 F (36.8 C) (06/12 0309) Pulse Rate:  [68-132] 83 (06/12 0309) Resp:  [16-20] 20 (06/12 0309) BP: (94-135)/(56-91) 116/76 (06/12 0309) SpO2:  [98 %-100 %] 100 % (06/12 0309)  Physical Exam:  General: alert and no distress  Lungs: clear to auscultation bilaterally Breasts: normal appearance, no masses or tenderness Heart: regular rate and rhythm, S1, S2 normal, no murmur, click, rub or gallop Abdomen: soft, non-tender; bowel sounds normal; no masses,  no organomegaly Pelvis: Lochia: appropriate, Uterine Fundus: firm Extremities: DVT Evaluation: No evidence of DVT seen on physical exam. Negative Homan's sign. No cords or calf tenderness. No significant calf/ankle edema.   Recent Labs    03/03/21 1140 03/04/21 0621  HGB 10.7* 8.2*  HCT 31.3* 24.5*    Assessment/Plan: Doing well postpartum Breastfeeding, Lactation consult Circumcision prior to discharge Contraception undecided Anemia of pregnancy, asymptomatic. To take PO iron supplementation postpartum.  Dispo: likely d/c home in a.m.     LOS: 1 day   Hildred Laser, MD [facility]

## 2021-03-04 NOTE — Discharge Summary (Signed)
Postpartum Discharge Summary      Patient Name: Courtney [name] DOB: 05/11/1986 MRN: [id number]  Date of admission: 03/03/2021 Delivery date:03/03/2021  Delivering provider: Rubie Maid  Date of discharge: 03/04/2021  Admitting diagnosis: Indication for care in labor or delivery [O75.9] Normal labor [O80, Z37.9] Intrauterine pregnancy: [redacted]w[redacted]d     Secondary diagnosis:  Active Problems:   Indication for care in labor or delivery   Normal labor  Additional problems:  Group B strep bacteremia, anemia of pregnancy   Discharge diagnosis: Term Pregnancy Delivered and Anemia                                              Post partum procedures: None Augmentation: AROM Complications: None  Hospital course: Onset of Labor With Vaginal Delivery      35 y.o. yo G1P1001 at [redacted]w[redacted]d was admitted in Active Labor on 03/03/2021. Patient had an uncomplicated labor course as follows:  Membrane Rupture Time/Date: 1:43 PM ,03/03/2021   Delivery Method:Vaginal, Spontaneous  Episiotomy: None  Lacerations:  1st degree;Vaginal  Patient had an uncomplicated postpartum course.  She is ambulating, tolerating a regular diet, passing flatus, and urinating well. Patient is discharged home in stable condition on 03/04/21.  Newborn Data: Birth date:03/03/2021  Birth time:5:22 PM  Gender:Female  Living status:Living  Apgars:8 ,9  Weight:3290 g   Magnesium Sulfate received: No BMZ received: No Rhophylac:No MMR:No T-DaP:Given prenatally Flu: No Transfusion:No  Physical exam  Vitals:   03/03/21 2134 03/03/21 2309 03/04/21 0309 03/04/21 1140  BP: 125/80 127/88 116/76 111/70  Pulse: (!) 104 92 83 89  Resp: $Remo'18 20 20 18  'Qlgja$ Temp: 98 F (36.7 C) 98.7 F (37.1 C) 98.2 F (36.8 C) 98.3 F (36.8 C)  TempSrc: Oral Oral Oral Oral  SpO2: 100% 100% 100% 100%  Weight:      Height:       General: alert, cooperative, and no distress Lochia: appropriate Uterine Fundus: firm Incision: N/A DVT Evaluation: No  evidence of DVT seen on physical exam. Negative Homan's sign. No cords or calf tenderness. No significant calf/ankle edema. Labs: Lab Results  Component Value Date   WBC 15.1 (H) 03/04/2021   HGB 8.2 (L) 03/04/2021   HCT 24.5 (L) 03/04/2021   MCV 92.8 03/04/2021   PLT 194 03/04/2021   CMP Latest Ref Rng & Units 06/15/2018  Glucose 70 - 99 mg/dL 89  BUN 6 - 20 mg/dL 9  Creatinine 0.44 - 1.00 mg/dL 0.80  Sodium 135 - 145 mmol/L 137  Potassium 3.5 - 5.1 mmol/L 3.8  Chloride 98 - 111 mmol/L 107  CO2 22 - 32 mmol/L 23  Calcium 8.9 - 10.3 mg/dL 9.0  Total Protein 6.5 - 8.1 g/dL -  Total Bilirubin 0.3 - 1.2 mg/dL -  Alkaline Phos 38 - 126 U/L -  AST 15 - 41 U/L -  ALT 14 - 54 U/L -   Edinburgh Score: Edinburgh Postnatal Depression Scale Screening Tool 03/04/2021  I have been able to laugh and see the funny side of things. 0  I have looked forward with enjoyment to things. 1  I have blamed myself unnecessarily when things went wrong. 2  I have been anxious or worried for no good reason. 2  I have felt scared or panicky for no good reason. 2  Things have been getting on top  of me. 2  I have been so unhappy that I have had difficulty sleeping. 2  I have felt sad or miserable. 2  I have been so unhappy that I have been crying. 2  The thought of harming myself has occurred to me. 0  Edinburgh Postnatal Depression Scale Total 15      After visit meds:  Allergies as of 03/04/2021   No Known Allergies      Medication List     STOP taking these medications    folic acid 376 MCG tablet Commonly known as: FOLVITE   Miconazole 7 100 MG vaginal suppository Generic drug: miconazole   multivitamin capsule   vitamin C 500 MG tablet Commonly known as: ASCORBIC ACID       TAKE these medications    docusate sodium 100 MG capsule Commonly known as: COLACE Take 1 capsule (100 mg total) by mouth 2 (two) times daily as needed.   ferrous sulfate 325 (65 FE) MG  tablet Commonly known as: FerrouSul Take 1 tablet (325 mg total) by mouth 2 (two) times daily.   ibuprofen 600 MG tablet Commonly known as: ADVIL Take 1 tablet (600 mg total) by mouth every 6 (six) hours. Start taking on: March 05, 2021   prenatal multivitamin Tabs tablet Take 1 tablet by mouth daily at 12 noon.         Discharge home in stable condition Infant Feeding: Breast Infant Disposition:home with mother Discharge instruction: per After Visit Summary and Postpartum booklet. Activity: Advance as tolerated. Pelvic rest for 6 weeks.  Diet: routine diet Anticipated Birth Control: Unsure Postpartum Appointment:6 weeks Additional Postpartum F/U: Postpartum Depression checkup in 2 weeks Future Appointments:No future appointments. Follow up Visit:  Follow-up Information     Rubie Maid, MD. Schedule an appointment as soon as possible for a visit.   Specialties: Obstetrics and Gynecology, Radiology Why: 2 week postpartum mood check (telephone visit) 6 week postpartum visit (in clinic) Contact information: [address] [place] [address] [phone]                     03/04/2021 Rubie Maid, MD

## 2021-03-14 ENCOUNTER — Telehealth: Payer: Self-pay | Admitting: Obstetrics and Gynecology

## 2021-03-14 NOTE — Telephone Encounter (Signed)
Patient called in and asked if she could speak to Dr. Valentino Saxon or her nurse.  Patient has some questions about the information on her FMLA paperwork.  Patient is requesting a call back as soon as possible because this paper work was supposed to be turned in on Monday.  Please advise.

## 2021-03-15 NOTE — Telephone Encounter (Signed)
Spoke to pt concerning her call to the office. Pt was looking for an email address for [facility] pt was informed that she could sent the information via mychart or drop it off at the office. Informed pt that there was a 25.00 fee and it would take 7-10 business days to complete. Pt stated, " I was not informed of that information,but okay". Pt stated that she would try to send the information via mychart.

## 2021-03-20 ENCOUNTER — Ambulatory Visit (INDEPENDENT_AMBULATORY_CARE_PROVIDER_SITE_OTHER): Payer: Self-pay | Admitting: Obstetrics and Gynecology

## 2021-03-20 ENCOUNTER — Other Ambulatory Visit: Payer: Self-pay

## 2021-03-20 ENCOUNTER — Encounter: Payer: Self-pay | Admitting: Obstetrics and Gynecology

## 2021-03-20 VITALS — Ht 68.0 in | Wt 135.0 lb

## 2021-03-20 DIAGNOSIS — O99345 Other mental disorders complicating the puerperium: Secondary | ICD-10-CM | POA: Diagnosis not present

## 2021-03-20 DIAGNOSIS — F53 Postpartum depression: Secondary | ICD-10-CM

## 2021-03-20 MED ORDER — SERTRALINE HCL 25 MG PO TABS: 25.0000 mg | ORAL_TABLET | Freq: Every day | ORAL | 3 refills | Status: AC

## 2021-03-20 NOTE — Patient Instructions (Signed)
Perinatal Mood and Anxiety Disorder Perinatal mood and anxiety disorder (PMAD) is a mental health condition that happens when a person feels excessive sadness, anger, or worry and tension (anxiety) during pregnancy or during the first few months after the birth. This condition can last a few months or may continue for years if left untreated. PMAD may cause serious problems for the mother, her baby, or the father if not properly managed. Depression and anxiety can interfere with the ability to take care of the baby. It also may affect work, school, relationships, and othereveryday activities. Having the baby blues is considered normal. Mild to moderate levels of sadness, exhaustion, and generally struggling with being a parent are considered the blues. Many parents experience these during the first 1-2 weeks after givingbirth. If these symptoms become worse or last too long, it may be PMAD. What are the causes? The exact cause of this condition is not known. It may result from a combination of hormone changes and biological, social, and psychologicalfactors. What increases the risk? The following factors may make you more likely to develop this condition: Having a personal or family history of depression, anxiety, or mood disorders. Experiencing a stressful life event during pregnancy, such as the death of a loved one. Having additional life stress, such as being a single parent. Having thyroid problems. What are the signs or symptoms? Symptoms of this condition include: Physical symptoms, such as: Panic attacks. These are intense episodes of fear or discomfort that may also cause sweating, nausea, shortness of breath, or fear of dying. They usually last 5-15 minutes but can last longer. Performing repetitive tasks to relieve stress or worry (obsessive compulsive disorder, or OCD). Problems with appetite or sleep. Emotional symptoms, such as: Excessive worry about problems or feeling like something  bad will happen (generalized anxiety disorder). Phobias, which are fears of certain objects or situations. Separation anxiety, or fear and stress about leaving certain people or loved ones. Behavioral symptoms, such as: Depression, or lack of motivation and energy. Intense mood swings involving emotional highs and lows. Feeling out of control or like you are going crazy. Having difficulty bonding with your baby. Some people also have trouble relaxing, problems concentrating, problems sleeping, frequent nightmares, and disturbing thoughts. PMAD can be differentfor everyone and can affect men as well as women. How is this diagnosed? This condition is diagnosed based on a physical exam and mental evaluation. In some cases, your health care provider may use an anxiety or depression screening tool. This includes a list of questions that can help a health care provider diagnose PMAD. You may be referred to a mental health expert who specializes in treating PMAD. How is this treated? This condition may be treated with: Talk therapy with a mental health professional. This may be family therapy, marriage therapy, cognitive behavioral therapy, or interpersonal therapy. Medicines. Your health care provider will discuss medicines that are safe to use during pregnancy and breastfeeding. Stress reduction therapies, such as mindfulness, deep breathing, or guided muscle relaxation. Support groups, early childhood education, or other groups to help with being a parent. Follow these instructions at home: Lifestyle Do not use any products that contain nicotine or tobacco. These products include cigarettes, chewing tobacco, and vaping devices, such as e-cigarettes. If you need help quitting, ask your health care provider. Do not drink alcohol when you are pregnant. It is also safest not to drink alcohol if you are breastfeeding. After your baby is born, if you drink alcohol: Limit how   much you have to 0-1 drink  a day. Be aware of how much alcohol is in your drink. In the [place] one drink equals one 12 oz bottle of beer (355 mL), one 5 oz glass of wine (148 mL), or one 1 oz glass of hard liquor (44 mL). Consider joining a support group for new mothers. Ask your health care provider for recommendations. Take good care of yourself. Make sure you: Get as much rest as possible. Talk with your partner about sharing the responsibility of getting up with your baby if possible. Make sleep a priority. Eat a healthy diet. This includes plenty of fruits and vegetables, whole grains, and lean proteins. Exercise regularly, as told by your health care provider. Ask your health care provider what exercises are safe for you. Talk with your partner about making sure you both have opportunities to exercise. General instructions Take over-the-counter and prescription medicines only as told by your health care provider. Talk with your partner or family members about your feelings during pregnancy. Share your concerns, needs, or anxieties with each other. Do not be afraid to ask for help. Find a mental health professional, if needed. Ask for help with tasks or chores when you need it. Ask friends and family members to provide meals, watch your children, or help with cleaning. If friends or family are not able to help, consider finding a licensed child care provider or professional house cleaner if needed. Let your partner know what you need. He or she may be struggling too. Keep all follow-up visits. This is important. Contact a health care provider if: You or people close to you notice that you have symptoms of anxiety or depression. Your symptoms of anxiety or depression get worse. You take medicines and have side effects that are uncomfortable or difficult to tolerate. Get help right away if: You feel like hurting yourself, your baby, or someone else. If you feel like you may hurt yourself or others, or have thoughts about  taking your own life, get help right away. Go to your nearest emergency department or: Call your local emergency services (911 in the [place] Call a suicide crisis helpline, such as the National Suicide Prevention Lifeline at 1-800-273-8255. This is open 24 hours a day in the [place] Text the Crisis Text Line at 741741 (in the [place] Summary Perinatal mood and anxiety disorder (PMAD) is when a woman or her partner feels excessive sadness, anger, or worry and tension (anxiety) during pregnancy or during the first few months after the birth. PMAD may include depression, intense mood swings, panic attacks, separation anxiety, phobias, or generalized anxiety. PMAD can cause problems for the mother, the baby, or the father if not properly managed. This condition is treated with medicines, talk therapy, stress reduction therapies, or a combination of treatments. Talk with your partner or family members about your concerns or fears. Ask for help when you need it. This information is not intended to replace advice given to you by your health care provider. Make sure you discuss any questions you have with your healthcare provider. Document Revised: 03/03/2020 Document Reviewed: 03/03/2020 Elsevier Patient Education  2022 [employer]  

## 2021-03-20 NOTE — Progress Notes (Signed)
Televisit-Pt having televisit for 2 week PPV. Pt provided weight but unable to provide bp. Pt stated that she would like depo for birth control and is currently breastfeeding. EPDS=25

## 2021-03-20 NOTE — Addendum Note (Signed)
Addended by: Fabian November on: 03/20/2021 05:03 PM   Modules accepted: Orders

## 2021-03-20 NOTE — Progress Notes (Signed)
Virtual Visit via Telephone Note  I connected with Courtney [name] on 03/20/21 at  4:15 PM EDT by telephone and verified that I am speaking with the correct person using two identifiers.  Location: Patient: Home Provider: Office   I discussed the limitations, risks, security and privacy concerns of performing an evaluation and management service by telephone and the availability of in person appointments. I also discussed with the patient that there may be a patient responsible charge related to this service. The patient expressed understanding and agreed to proceed.   History of Present Illness: Courtney [name] is a 35 y.o. G42P1001 female who is 2 weeks postpartum s/p SVD at [redacted] weeks gestation. She reports feeling some stress and feelings of being overwhelmed.  She reports that she has a good support system at home.  However due to her lack of sleep and all the demands of being a new mother she is experiencing a lot of tearfulness and feeling down.  She denies SI/HI.  She does note that she is mostly breast pumping but does occasionally place baby to the breast.  Was having some issues with engorgement in the first few days after being discharged from the hospital however after purchasing a new pump she is getting better emptying of her breast.  She does note that maintaining the electric pump is more work than she thought, and sometimes gets frustrated.  Notes that her vaginal bleeding is normal.  Has not engaged in sexual activity as of yet.  She  plans on Depo-Provera for contraception.  Observations/Objective: Height 5\' 8"  (1.727 m), weight 135 lb (61.2 kg), last menstrual period 05/11/2020, currently breastfeeding. Gen App: overall no acute distress Psych: depressed mood, normal speech and thought content.     Edinburgh Postnatal Depression Scale - 03/20/21 1607       Edinburgh Postnatal Depression Scale:  In the Past 7 Days   I have been able to laugh and see the funny side of  things. 2    I have looked forward with enjoyment to things. 3    I have blamed myself unnecessarily when things went wrong. 3    I have been anxious or worried for no good reason. 3    I have felt scared or panicky for no good reason. 2    Things have been getting on top of me. 1    I have been so unhappy that I have had difficulty sleeping. 3    I have felt sad or miserable. 3    I have been so unhappy that I have been crying. 3    The thought of harming myself has occurred to me. 2    Edinburgh Postnatal Depression Scale Total 25              Assessment and Plan: Postpartum depression - Discussion had with patient today regarding symptoms.  Likely suffering from postpartum depression.  Currently no SI/HI.  Patient notes that she has good support at home.  Advised on getting as much rest as possible and utilizing her support system as sleep deprivation can also intensify symptoms. Discussed options for management, including counseling and/or medications, or both.  Patient desires to try both for now.  Will prescribe Zoloft 25 mg and titrate dosing up after 2 weeks.  Will also refer to counseling. To follow up in 4 weeks with postpartum visit.  Postpartum care following vaginal delivery - overall physically doing well, bleeding is wnl, no major issues Lactating  mother - doing better with breast pumping. No signs/symptoms of mastitis.    Follow Up Instructions: RTC in 4 weeks for final postpartum visit and reassessment of mood symptoms.    I discussed the assessment and treatment plan with the patient. The patient was provided an opportunity to ask questions and all were answered. The patient agreed with the plan and demonstrated an understanding of the instructions.   The patient was advised to call back or seek an in-person evaluation if the symptoms worsen or if the condition fails to improve as anticipated.  I provided 13 minutes of non-face-to-face time during this  encounter.   Hildred Laser, MD [facility]

## 2021-04-04 ENCOUNTER — Telehealth: Payer: Self-pay | Admitting: Licensed Clinical Social Worker

## 2021-04-04 NOTE — Telephone Encounter (Signed)
-----   Message from Kathreen Cosier, [place] sent at 04/03/2021  1:51 PM EDT ----- Regarding: referral Referral from Robin [place], RN EDPS = 16.

## 2021-04-11 ENCOUNTER — Telehealth: Payer: Self-pay | Admitting: Obstetrics and Gynecology

## 2021-04-11 NOTE — Telephone Encounter (Signed)
Patient called back to let us know she will make her form fee payment this week for FMLA. Pt also asked for a doctor's note to go back to work- works remotely. Please Advise.

## 2021-04-12 NOTE — Telephone Encounter (Signed)
Spoke to pt she needed a return back to work note after post partum care. Pt requested that she needed the note for April 16, 2021. Letter completed sent to pt via mychart and printed for patient and place at front desk for pick. Pt was unsure if she pick up the letter or print from her mychart.

## 2021-04-17 ENCOUNTER — Encounter: Payer: Self-pay | Admitting: Obstetrics and Gynecology

## 2021-04-17 NOTE — Progress Notes (Deleted)
   [facility] POSTPARTUM CLINIC PROGRESS NOTE  Subjective:     Courtney [name] is a 35 y.o. G50P1001 female who presents for a postpartum visit. She is 6 weeks postpartum following a spontaneous vaginal delivery. I have fully reviewed the prenatal and intrapartum course. The delivery was at [redacted]w[redacted]d gestational weeks.  Anesthesia: {anesthesia types:812}. Postpartum course has been ***. Baby's course has been ***. Baby is feeding by {breast/bottle:69}. Bleeding: patient {HAS HAS VVO:16073} not resumed menses, with Patient's last menstrual period was 05/11/2020 (exact date).. Bowel function is {normal:32111}. Bladder function is {normal:32111}. Patient {is/is not:9024} sexually active. Contraception method desired is {contraceptive method:5051}. Postpartum depression screening: {neg default:13464::"negative"}.  EDPS score is ***.    The following portions of the patient's history were reviewed and updated as appropriate: allergies, current medications, past family history, past medical history, past social history, past surgical history, and problem list.  Review of Systems {ros; complete:30496}   Objective:    LMP 05/11/2020 (Exact Date)   General:  alert and no distress   Breasts:  inspection negative, no nipple discharge or bleeding, no masses or nodularity palpable  Lungs: clear to auscultation bilaterally  Heart:  regular rate and rhythm, S1, S2 normal, no murmur, click, rub or gallop  Abdomen: soft, non-tender; bowel sounds normal; no masses,  no organomegaly.  ***Well healed Pfannenstiel incision   Vulva:  normal  Vagina: normal vagina, no discharge, exudate, lesion, or erythema  Cervix:  no cervical motion tenderness and no lesions  Corpus: normal size, contour, position, consistency, mobility, non-tender  Adnexa:  normal adnexa and no mass, fullness, tenderness  Rectal Exam: Not performed.         Labs:  Lab Results  Component Value Date   HGB 8.2 (L) 03/04/2021     Assessment:    1. Postpartum care following vaginal delivery      Plan:    1. Contraception: {method:5051} 2. Will check Hgb for h/o postpartum anemia of less than 10.  3. Follow up in: 5 months for annual exam.     Silvano Bilis, LPN [facility]

## 2021-04-26 ENCOUNTER — Ambulatory Visit: Payer: Self-pay | Admitting: Licensed Clinical Social Worker

## 2021-05-10 ENCOUNTER — Ambulatory Visit: Payer: Self-pay | Admitting: Licensed Clinical Social Worker

## 2021-05-17 ENCOUNTER — Telehealth: Payer: Self-pay

## 2021-05-17 NOTE — Telephone Encounter (Signed)
Call transferred from front desk-   Robin [place] RN from [facility]  had f/u with pt today.  She wanted to make [place] aware that her Inocente Salles is a 16. She thinks the pt is taking  her Zoloft but not sure. Robin placed a referral to counselor but pt does not have an appt yet.   Pt n/s her ppv on 7/26. Attempted to call pt to get scheduled. VM not set up. Sent my chart message also.   Called Angela(mom) LM to call me.   Robin's number - [phone].   Will continue to try and reach out to pt.   What else can I do?  Ty  CM

## 2021-05-17 NOTE — Telephone Encounter (Signed)
We could try to send a letter? Unfortunately nothing else that we can do.  The rest is up to her to reach out to establish care.

## 2021-05-21 NOTE — Telephone Encounter (Signed)
Tired to call pt again. NO answer. No VM set up. Letter sent via my chart.   Mailed letter also.

## 2021-12-13 ENCOUNTER — Emergency Department (HOSPITAL_COMMUNITY): Payer: Medicaid Other

## 2021-12-13 ENCOUNTER — Emergency Department (HOSPITAL_COMMUNITY)
Admission: EM | Admit: 2021-12-13 | Discharge: 2021-12-13 | Disposition: A | Discharge status: Home / Self Care | Payer: Medicaid Other | Attending: Emergency Medicine | Admitting: Emergency Medicine

## 2021-12-13 ENCOUNTER — Other Ambulatory Visit: Payer: Self-pay

## 2021-12-13 DIAGNOSIS — Z79899 Other long term (current) drug therapy: Secondary | ICD-10-CM | POA: Insufficient documentation

## 2021-12-13 DIAGNOSIS — Y92410 Unspecified street and highway as the place of occurrence of the external cause: Secondary | ICD-10-CM | POA: Insufficient documentation

## 2021-12-13 DIAGNOSIS — S3991XA Unspecified injury of abdomen, initial encounter: Secondary | ICD-10-CM | POA: Insufficient documentation

## 2021-12-13 DIAGNOSIS — F419 Anxiety disorder, unspecified: Secondary | ICD-10-CM | POA: Insufficient documentation

## 2021-12-13 DIAGNOSIS — S161XXA Strain of muscle, fascia and tendon at neck level, initial encounter: Secondary | ICD-10-CM | POA: Insufficient documentation

## 2021-12-13 DIAGNOSIS — S01511A Laceration without foreign body of lip, initial encounter: Secondary | ICD-10-CM | POA: Insufficient documentation

## 2021-12-13 DIAGNOSIS — Z20822 Contact with and (suspected) exposure to covid-19: Secondary | ICD-10-CM | POA: Diagnosis not present

## 2021-12-13 DIAGNOSIS — R0789 Other chest pain: Secondary | ICD-10-CM | POA: Diagnosis not present

## 2021-12-13 DIAGNOSIS — S0990XA Unspecified injury of head, initial encounter: Secondary | ICD-10-CM | POA: Diagnosis present

## 2021-12-13 DIAGNOSIS — S20219A Contusion of unspecified front wall of thorax, initial encounter: Secondary | ICD-10-CM | POA: Diagnosis not present

## 2021-12-13 DIAGNOSIS — Y990 Civilian activity done for income or pay: Secondary | ICD-10-CM | POA: Diagnosis not present

## 2021-12-13 LAB — URINALYSIS, ROUTINE W REFLEX MICROSCOPIC
Bilirubin Urine: NEGATIVE
Glucose, UA: NEGATIVE mg/dL
Ketones, ur: NEGATIVE mg/dL
Leukocytes,Ua: NEGATIVE
Nitrite: NEGATIVE
Protein, ur: NEGATIVE mg/dL
Specific Gravity, Urine: 1.009 (ref 1.005–1.030)
pH: 7 (ref 5.0–8.0)

## 2021-12-13 LAB — I-STAT CHEM 8, ED
BUN: 7 mg/dL (ref 6–20)
Calcium, Ion: 1.19 mmol/L (ref 1.15–1.40)
Chloride: 106 mmol/L (ref 98–111)
Creatinine, Ser: 0.8 mg/dL (ref 0.44–1.00)
Glucose, Bld: 96 mg/dL (ref 70–99)
HCT: 37 % (ref 36.0–46.0)
Hemoglobin: 12.6 g/dL (ref 12.0–15.0)
Potassium: 3.9 mmol/L (ref 3.5–5.1)
Sodium: 140 mmol/L (ref 135–145)
TCO2: 24 mmol/L (ref 22–32)

## 2021-12-13 LAB — RAPID URINE DRUG SCREEN, HOSP PERFORMED
Amphetamines: NOT DETECTED
Barbiturates: NOT DETECTED
Benzodiazepines: NOT DETECTED
Cocaine: NOT DETECTED
Opiates: NOT DETECTED
Tetrahydrocannabinol: NOT DETECTED

## 2021-12-13 LAB — CBC
HCT: 37.1 % (ref 36.0–46.0)
Hemoglobin: 12.3 g/dL (ref 12.0–15.0)
MCH: 31.1 pg (ref 26.0–34.0)
MCHC: 33.2 g/dL (ref 30.0–36.0)
MCV: 93.9 fL (ref 80.0–100.0)
Platelets: 224 10*3/uL (ref 150–400)
RBC: 3.95 MIL/uL (ref 3.87–5.11)
RDW: 13.5 % (ref 11.5–15.5)
WBC: 6.8 10*3/uL (ref 4.0–10.5)
nRBC: 0 % (ref 0.0–0.2)

## 2021-12-13 LAB — COMPREHENSIVE METABOLIC PANEL
ALT: 15 U/L (ref 0–44)
AST: 19 U/L (ref 15–41)
Albumin: 3.8 g/dL (ref 3.5–5.0)
Alkaline Phosphatase: 57 U/L (ref 38–126)
Anion gap: 8 (ref 5–15)
BUN: 8 mg/dL (ref 6–20)
CO2: 22 mmol/L (ref 22–32)
Calcium: 9.5 mg/dL (ref 8.9–10.3)
Chloride: 109 mmol/L (ref 98–111)
Creatinine, Ser: 0.87 mg/dL (ref 0.44–1.00)
GFR, Estimated: >60 mL/min (ref >60)
Glucose, Bld: 97 mg/dL (ref 70–99)
Potassium: 3.9 mmol/L (ref 3.5–5.1)
Sodium: 139 mmol/L (ref 135–145)
Total Bilirubin: 1.4 mg/dL — ABNORMAL HIGH (ref 0.3–1.2)
Total Protein: 7.4 g/dL (ref 6.5–8.1)

## 2021-12-13 LAB — PROTIME-INR
INR: 1 (ref 0.8–1.2)
Prothrombin Time: 13.6 seconds (ref 11.4–15.2)

## 2021-12-13 LAB — SAMPLE TO BLOOD BANK

## 2021-12-13 LAB — RESP PANEL BY RT-PCR (FLU A&B, COVID) ARPGX2
Influenza A by PCR: NEGATIVE
Influenza B by PCR: NEGATIVE
SARS Coronavirus 2 by RT PCR: NEGATIVE

## 2021-12-13 LAB — I-STAT BETA HCG BLOOD, ED (MC, WL, AP ONLY): I-stat hCG, quantitative: <5 m[IU]/mL (ref <5)

## 2021-12-13 LAB — ETHANOL: Alcohol, Ethyl (B): <10 mg/dL (ref <10)

## 2021-12-13 MED ORDER — SODIUM CHLORIDE 0.9 % IV BOLUS
125.0000 mL | Freq: Once | INTRAVENOUS | Status: AC
  Administered 2021-12-13: 125 mL via INTRAVENOUS

## 2021-12-13 MED ORDER — METHOCARBAMOL 500 MG PO TABS: 500.0000 mg | ORAL_TABLET | Freq: Four times a day (QID) | ORAL | 0 refills | Status: AC | PRN

## 2021-12-13 MED ORDER — KETOROLAC TROMETHAMINE 30 MG/ML IJ SOLN
30.0000 mg | Freq: Once | INTRAMUSCULAR | Status: AC
  Administered 2021-12-13: 30 mg via INTRAVENOUS
  Filled 2021-12-13: qty 1

## 2021-12-13 MED ORDER — IBUPROFEN 800 MG PO TABS: 800.0000 mg | ORAL_TABLET | Freq: Three times a day (TID) | ORAL | 0 refills | Status: AC

## 2021-12-13 MED ORDER — IOHEXOL 300 MG/ML  SOLN
100.0000 mL | Freq: Once | INTRAMUSCULAR | Status: AC | PRN
  Administered 2021-12-13: 100 mL via INTRAVENOUS
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
## 2021-12-13 NOTE — ED Notes (Signed)
Patient transported to CT 
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
## 2021-12-13 NOTE — ED Notes (Signed)
C collar removed per MD 
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
## 2022-04-25 ENCOUNTER — Encounter: Payer: Self-pay | Admitting: Nurse Practitioner

## 2022-04-25 ENCOUNTER — Ambulatory Visit: Payer: Medicaid Other | Admitting: Nurse Practitioner

## 2022-04-25 DIAGNOSIS — F339 Major depressive disorder, recurrent, unspecified: Secondary | ICD-10-CM | POA: Insufficient documentation

## 2022-04-25 DIAGNOSIS — R569 Unspecified convulsions: Secondary | ICD-10-CM | POA: Insufficient documentation

## 2022-04-25 DIAGNOSIS — F419 Anxiety disorder, unspecified: Secondary | ICD-10-CM | POA: Insufficient documentation

## 2022-04-25 DIAGNOSIS — Z113 Encounter for screening for infections with a predominantly sexual mode of transmission: Secondary | ICD-10-CM

## 2022-04-25 LAB — WET PREP FOR TRICH, YEAST, CLUE
Trichomonas Exam: NEGATIVE
Yeast Exam: NEGATIVE
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
## 2022-04-25 NOTE — Progress Notes (Signed)
Pt here for STD screening.  Wet mount results reviewed, no treatment required per SO.  Condoms declined.  Brady Plant M Delbert Vu, RN  

## 2022-04-25 NOTE — Progress Notes (Signed)
[facility] Department  STI clinic/screening visit [address] [place] [address] [phone]  Subjective:  Courtney [name] is a 36 y.o. female being seen today for an STI screening visit. The patient reports they do have symptoms.  Patient reports that they do not desire a pregnancy in the next year.   They reported they are not interested in discussing contraception today.    Patient's last menstrual period was 04/18/2022.   Patient has the following medical conditions:   Patient Active Problem List   Diagnosis Date Noted   Seizures (HCC) 04/25/2022   Postpartum depression 03/20/2021   Indication for care in labor or delivery 03/03/2021   Normal labor 03/03/2021   Uterine contractions 02/27/2021   Abdominal complaints 01/28/2021   Pregnancy 11/22/2020   Syncope 07/23/2017   Cervical dysplasia or atypia 05/31/2015   CIN II (cervical intraepithelial neoplasia II) 09/08/2013    Chief Complaint  Patient presents with   SEXUALLY TRANSMITTED DISEASE    Screening -patient states she is having some vaginal odor    HPI  Patient reports to clinic today for STD screening.  Patient reports some odor that she noticed a couple of days prior to menstrual period.    Last HIV test per patient/review of record was 07/2020 Patient reports last pap was 08/2017.   Screening for MPX risk: Does the patient have an unexplained rash? No Is the patient MSM? No Does the patient endorse multiple sex partners or anonymous sex partners? No Did the patient have close or sexual contact with a person diagnosed with MPX? No Has the patient traveled outside the [place] where MPX is endemic? No Is there a high clinical suspicion for MPX-- evidenced by one of the following No  -Unlikely to be chickenpox  -Lymphadenopathy  -Rash that present in same phase of evolution on any given body part See flowsheet for further details and programmatic requirements.   Immunization history:    There is no immunization history on file for this patient.   The following portions of the patient's history were reviewed and updated as appropriate: allergies, current medications, past medical history, past social history, past surgical history and problem list.  Objective:  There were no vitals filed for this visit.  Physical Exam Constitutional:      Appearance: Normal appearance.  HENT:     Head: Normocephalic. No abrasion, masses or laceration. Hair is normal.     Right Ear: External ear normal.     Left Ear: External ear normal.     Nose: Nose normal.     Mouth/Throat:     Lips: Pink.     Mouth: Mucous membranes are moist. No oral lesions.     Dentition: No dental caries.     Pharynx: No oropharyngeal exudate or posterior oropharyngeal erythema.     Tonsils: No tonsillar exudate or tonsillar abscesses.  Eyes:     General: Lids are normal.        Right eye: No discharge.        Left eye: No discharge.     Conjunctiva/sclera: Conjunctivae normal.     Right eye: No exudate.    Left eye: No exudate. Abdominal:     General: Abdomen is flat.     Palpations: Abdomen is soft.     Tenderness: There is no abdominal tenderness. There is no rebound.  Genitourinary:    Pubic Area: No rash or pubic lice.      Labia:  Right: No rash, tenderness, lesion or injury.        Left: No rash, tenderness, lesion or injury.      Vagina: Normal. No vaginal discharge, erythema or lesions.     Cervix: No cervical motion tenderness, discharge, lesion or erythema.     Uterus: Not enlarged and not tender.      Rectum: Normal.     Comments: Amount Discharge: small  Odor: No pH: less than 4.5 Adheres to vaginal wall: No Color: color of discharge matches the Courtney [name] swab Musculoskeletal:     Cervical back: Full passive range of motion without pain, normal range of motion and neck supple.  Lymphadenopathy:     Cervical: No cervical adenopathy.     Right cervical: No superficial, deep  or posterior cervical adenopathy.    Left cervical: No superficial, deep or posterior cervical adenopathy.     Upper Body:     Right upper body: No supraclavicular, axillary or epitrochlear adenopathy.     Left upper body: No supraclavicular, axillary or epitrochlear adenopathy.     Lower Body: No right inguinal adenopathy. No left inguinal adenopathy.  Skin:    General: Skin is warm and dry.     Findings: No lesion or rash.  Neurological:     Mental Status: She is alert and oriented to person, place, and time.  Psychiatric:        Attention and Perception: Attention normal.        Mood and Affect: Mood normal.        Speech: Speech normal.        Behavior: Behavior normal. Behavior is cooperative.      Assessment and Plan:  SHARISSE RANTZ is a 36 y.o. female presenting to the [facility] Department for STI screening  1. Screening examination for venereal disease -36 year old female in clinic today for STD screening. -Patient accepted all screenings including vaginal CT/GC, wet prep and bloodwork for HIV/RPR.  Patient meets criteria for HepB screening? No. Ordered? No - low risk Patient meets criteria for HepC screening? No. Ordered? No - low risk  Treat wet prep per standing order Discussed time line for [facility] results and that patient will be called with positive results and encouraged patient to call if she had not heard in 2 weeks.  Counseled to return or seek care for continued or worsening symptoms Recommended condom use with all sex  Patient is currently not using  contraception  to prevent pregnancy.    - Chlamydia/Gonorrhea [facility] - WET PREP FOR TRICH, YEAST, CLUE   2. Anxiety -History of anxiety and depression.  Referral submitted for counseling services.   - Ambulatory referral to Behavioral Health  3. Depression, recurrent (HCC) -History of anxiety and depression.  Referral submitted for counseling services.   - Ambulatory referral to  Behavioral Health   Total time spent: 30 minutes    Return if symptoms worsen or fail to improve.    Glenna Fellows, FNP
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
## 2023-07-17 ENCOUNTER — Ambulatory Visit: Payer: Medicaid Other

## 2023-07-23 ENCOUNTER — Telehealth: Payer: Medicaid Other

## 2023-07-23 DIAGNOSIS — N898 Other specified noninflammatory disorders of vagina: Secondary | ICD-10-CM

## 2023-07-24 NOTE — Progress Notes (Signed)
No response from end of patient at end of shift. Will mark as a no-charge and close encounter.

## 2023-07-24 NOTE — Progress Notes (Signed)
Message sent to patient requesting further input regarding current symptoms. Awaiting patient response.  

## 2023-07-25 ENCOUNTER — Ambulatory Visit: Payer: Medicaid Other

## 2023-07-28 ENCOUNTER — Ambulatory Visit: Payer: Medicaid Other

## 2023-09-07 ENCOUNTER — Other Ambulatory Visit: Payer: Self-pay

## 2023-09-07 ENCOUNTER — Emergency Department: Payer: Medicaid Other

## 2023-09-07 ENCOUNTER — Emergency Department
Admission: EM | Admit: 2023-09-07 | Discharge: 2023-09-07 | Disposition: A | Discharge status: Home / Self Care | Payer: Medicaid Other | Attending: Emergency Medicine | Admitting: Emergency Medicine

## 2023-09-07 DIAGNOSIS — Z1152 Encounter for screening for COVID-19: Secondary | ICD-10-CM | POA: Insufficient documentation

## 2023-09-07 DIAGNOSIS — J069 Acute upper respiratory infection, unspecified: Secondary | ICD-10-CM | POA: Insufficient documentation

## 2023-09-07 DIAGNOSIS — R059 Cough, unspecified: Secondary | ICD-10-CM | POA: Diagnosis present

## 2023-09-07 LAB — RESP PANEL BY RT-PCR (RSV, FLU A&B, COVID)  RVPGX2
Influenza A by PCR: NEGATIVE
Influenza B by PCR: NEGATIVE
Resp Syncytial Virus by PCR: NEGATIVE
SARS Coronavirus 2 by RT PCR: NEGATIVE

## 2023-09-07 MED ORDER — BENZONATATE 100 MG PO CAPS: 100.0000 mg | ORAL_CAPSULE | Freq: Three times a day (TID) | ORAL | 0 refills | Status: AC | PRN

## 2023-09-07 MED ORDER — AZITHROMYCIN 250 MG PO TABS: ORAL_TABLET | ORAL | 0 refills | Status: AC

## 2023-09-07 NOTE — ED Provider Notes (Signed)
[facility] Provider Note    Event Date/Time   First MD Initiated Contact with Patient 09/07/23 1443     (approximate)   History   Cough   HPI  Courtney [name] is a 37 y.o. female history of seizures presents emergency department with cough, congestion.  Body aches and pains.  Son had same symptoms last week.  Symptom been ongoing for 4 days.  Been taking over-the-counter medications without any relief.  No chest pain or shortness of breath      Physical Exam   Triage Vital Signs: ED Triage Vitals  Encounter Vitals Group     BP 09/07/23 1354 (!) 108/58     Systolic BP Percentile --      Diastolic BP Percentile --      Pulse Rate 09/07/23 1354 72     Resp 09/07/23 1354 18     Temp 09/07/23 1354 98.9 F (37.2 C)     Temp src --      SpO2 09/07/23 1354 96 %     Weight 09/07/23 1355 150 lb (68 kg)     Height 09/07/23 1355 5\' 6"  (1.676 m)     Head Circumference --      Peak Flow --      Pain Score 09/07/23 1355 7     Pain Loc --      Pain Education --      Exclude from Growth Chart --     Most recent vital signs: Vitals:   09/07/23 1354  BP: (!) 108/58  Pulse: 72  Resp: 18  Temp: 98.9 F (37.2 C)  SpO2: 96%     General: Awake, no distress.   CV:  Good peripheral perfusion. regular rate and  rhythm Resp:  Normal effort. Lungs CTA Abd:  No distention.   Other:      ED Results / Procedures / Treatments   Labs (all labs ordered are listed, but only abnormal results are displayed) Labs Reviewed  RESP PANEL BY RT-PCR (RSV, FLU A&B, COVID)  [id number]     EKG     RADIOLOGY Chest x-ray    PROCEDURES:   Procedures   MEDICATIONS ORDERED IN ED: Medications - No data to display   IMPRESSION / MDM / ASSESSMENT AND PLAN / ED [name]  I reviewed the triage vital signs and the nursing notes.                              Differential diagnosis includes, but is not limited to, acute bronchitis, COVID, influenza, RSV,  CAP  Patient's presentation is most consistent with acute illness / injury with system symptoms.   Labs are reassuring, chest x-ray independently reviewed interpreted by me as being negative for any acute abnormality  Did explain these findings to the patient.  She is to follow-up with her regular doctor.  I did place her on a Z-Pak Tessalon Perles.  Return to emergency department worsening.  Patient is in agreement treatment plan.  Discharged stable condition.      FINAL CLINICAL IMPRESSION(S) / ED DIAGNOSES   Final diagnoses:  Acute URI     Rx / DC Orders   ED Discharge Orders          Ordered    azithromycin (ZITHROMAX Z-PAK) 250 MG tablet        09/07/23 1449    benzonatate (TESSALON PERLES) 100 MG capsule  3  times daily PRN        09/07/23 1449             Note:  This document was prepared using Dragon voice recognition software and may include unintentional dictation errors.    Faythe Ghee, PA-C 09/07/23 1459    Jene Every, MD 09/07/23 [phone]

## 2023-09-07 NOTE — ED Provider Triage Note (Signed)
Emergency Medicine Provider Triage Evaluation Note  Courtney [name] , a 37 y.o. female  was evaluated in triage.  Pt complains of uri sx son was also sick last week.  Review of Systems  Positive:  Negative:   Physical Exam  BP (!) 108/58   Pulse 72   Temp 98.9 F (37.2 C)   Resp 18   Ht 5\' 6"  (1.676 m)   Wt 68 kg   LMP 08/24/2023   SpO2 96%   BMI 24.21 kg/m  Gen:   Awake, no distress   Resp:  Normal effort  MSK:   Moves extremities without difficulty  Other:    Medical Decision Making  Medically screening exam initiated at 1:57 PM.  Appropriate orders placed.  STEFANEE GRIGAS was informed that the remainder of the evaluation will be completed by another provider, this initial triage assessment does not replace that evaluation, and the importance of remaining in the ED until their evaluation is complete.     Faythe Ghee, PA-C 09/07/23 1357

## 2023-09-07 NOTE — ED Triage Notes (Signed)
Pt comes cough, congestion, body aches and pains. Pt states fever. Pt states this all started 4 days ago. Pt denies any relief with meds.

## 2023-10-10 ENCOUNTER — Encounter: Payer: Self-pay | Admitting: Emergency Medicine

## 2023-10-10 ENCOUNTER — Emergency Department
Admission: EM | Admit: 2023-10-10 | Discharge: 2023-10-10 | Disposition: A | Discharge status: Home / Self Care | Payer: Medicaid Other | Attending: Emergency Medicine | Admitting: Emergency Medicine

## 2023-10-10 ENCOUNTER — Other Ambulatory Visit: Payer: Self-pay

## 2023-10-10 DIAGNOSIS — K529 Noninfective gastroenteritis and colitis, unspecified: Secondary | ICD-10-CM | POA: Insufficient documentation

## 2023-10-10 DIAGNOSIS — R112 Nausea with vomiting, unspecified: Secondary | ICD-10-CM | POA: Diagnosis present

## 2023-10-10 LAB — COMPREHENSIVE METABOLIC PANEL
ALT: 16 U/L (ref 0–44)
AST: 25 U/L (ref 15–41)
Albumin: 4.4 g/dL (ref 3.5–5.0)
Alkaline Phosphatase: 49 U/L (ref 38–126)
Anion gap: 9 (ref 5–15)
BUN: 16 mg/dL (ref 6–20)
CO2: 19 mmol/L — ABNORMAL LOW (ref 22–32)
Calcium: 9.5 mg/dL (ref 8.9–10.3)
Chloride: 109 mmol/L (ref 98–111)
Creatinine, Ser: 0.72 mg/dL (ref 0.44–1.00)
GFR, Estimated: >60 mL/min (ref >60)
Glucose, Bld: 101 mg/dL — ABNORMAL HIGH (ref 70–99)
Potassium: 4.9 mmol/L (ref 3.5–5.1)
Sodium: 137 mmol/L (ref 135–145)
Total Bilirubin: 1.8 mg/dL — ABNORMAL HIGH (ref 0.0–1.2)
Total Protein: 8.7 g/dL — ABNORMAL HIGH (ref 6.5–8.1)

## 2023-10-10 LAB — CBC
HCT: 42.6 % (ref 36.0–46.0)
Hemoglobin: 14.5 g/dL (ref 12.0–15.0)
MCH: 32 pg (ref 26.0–34.0)
MCHC: 34 g/dL (ref 30.0–36.0)
MCV: 94 fL (ref 80.0–100.0)
Platelets: 211 10*3/uL (ref 150–400)
RBC: 4.53 MIL/uL (ref 3.87–5.11)
RDW: 12.4 % (ref 11.5–15.5)
WBC: 11 10*3/uL — ABNORMAL HIGH (ref 4.0–10.5)
nRBC: 0.2 % (ref 0.0–0.2)

## 2023-10-10 LAB — LIPASE, BLOOD: Lipase: 21 U/L (ref 11–51)

## 2023-10-10 MED ORDER — ONDANSETRON HCL 4 MG/2ML IJ SOLN
4.0000 mg | Freq: Once | INTRAMUSCULAR | Status: AC | PRN
  Administered 2023-10-10: 4 mg via INTRAVENOUS
  Filled 2023-10-10: qty 2

## 2023-10-10 MED ORDER — ONDANSETRON 4 MG PO TBDP: 4.0000 mg | ORAL_TABLET | Freq: Three times a day (TID) | ORAL | 0 refills | Status: AC | PRN

## 2023-10-10 MED ORDER — SODIUM CHLORIDE 0.9 % IV BOLUS
1000.0000 mL | Freq: Once | INTRAVENOUS | Status: AC
  Administered 2023-10-10: 1000 mL via INTRAVENOUS

## 2023-10-10 NOTE — ED Triage Notes (Signed)
Pt reports chills, abd pain, nausea vomiting and diarrhea that started 2 days ago. Pt states that her 36 year old came home from daycare with it as well

## 2023-10-10 NOTE — ED Notes (Signed)
See triage note  Presents with a 2 day hx of n/v/d  No fever

## 2023-10-10 NOTE — ED Provider Notes (Signed)
[facility] Provider Note    Event Date/Time   First MD Initiated Contact with Patient 10/10/23 1437     (approximate)   History   Emesis and Diarrhea   HPI  Courtney [name] is a 38 y.o. female with no significant past medical history presents with nausea vomiting diarrhea, abdominal cramping with started yesterday.  She reported her child came home from daycare with similar symptoms 2 days ago.  No fevers reported.     Physical Exam   Triage Vital Signs: ED Triage Vitals  Encounter Vitals Group     BP 10/10/23 1337 92/65     Systolic BP Percentile --      Diastolic BP Percentile --      Pulse Rate 10/10/23 1337 (!) 112     Resp 10/10/23 1337 16     Temp 10/10/23 1337 98.3 F (36.8 C)     Temp Source 10/10/23 1337 Oral     SpO2 10/10/23 1337 100 %     Weight 10/10/23 1338 64 kg (141 lb)     Height 10/10/23 1338 1.676 m (5\' 6" )     Head Circumference --      Peak Flow --      Pain Score 10/10/23 1338 10     Pain Loc --      Pain Education --      Exclude from Growth Chart --     Most recent vital signs: Vitals:   10/10/23 1337 10/10/23 1500  BP: 92/65 117/70  Pulse: (!) 112 85  Resp: 16 17  Temp: 98.3 F (36.8 C)   SpO2: 100% 100%     General: Awake, no distress.  CV:  Good peripheral perfusion. Resp:  Normal effort.  Abd:  No distention.  Soft, nontender, reassuring exam Other:     ED Results / Procedures / Treatments   Labs (all labs ordered are listed, but only abnormal results are displayed) Labs Reviewed  COMPREHENSIVE METABOLIC PANEL - Abnormal; Notable for the following components:      Result Value   CO2 19 (*)    Glucose, Bld 101 (*)    Total Protein 8.7 (*)    Total Bilirubin 1.8 (*)    All other components within normal limits  CBC - Abnormal; Notable for the following components:   WBC 11.0 (*)    All other components within normal limits  LIPASE, BLOOD  URINALYSIS, ROUTINE W REFLEX MICROSCOPIC  POC  URINE PREG, ED     EKG     RADIOLOGY     PROCEDURES:  Critical Care performed:   Procedures   MEDICATIONS ORDERED IN ED: Medications  ondansetron (ZOFRAN) injection 4 mg (4 mg Intravenous Given 10/10/23 1352)  sodium chloride 0.9 % bolus 1,000 mL (0 mLs Intravenous Stopped 10/10/23 1535)     IMPRESSION / MDM / ASSESSMENT AND PLAN / ED COURSE  I reviewed the triage vital signs and the nursing notes. Patient's presentation is most consistent with acute illness / injury with system symptoms.  Patient presents with nausea vomiting diarrhea as detailed above, mild abdominal cramping, reassuring abdominal exam.  Suspicious for viral gastroenteritis which is prevalent likely at this time.  Lab work reviewed and is overall reassuring, mild elevation of white blood cell count is nonspecific.  Patient treated with IV fluids, IV Zofran and is feeling significantly improved, appropriate for discharge with outpatient follow-up as needed, return precautions discussed, patient agrees with this plan.  FINAL CLINICAL IMPRESSION(S) / ED DIAGNOSES   Final diagnoses:  Gastroenteritis     Rx / DC Orders   ED Discharge Orders          Ordered    ondansetron (ZOFRAN-ODT) 4 MG disintegrating tablet  Every 8 hours PRN        10/10/23 1523             Note:  This document was prepared using Dragon voice recognition software and may include unintentional dictation errors.   Jene Every, MD 10/10/23 [phone]
